# Patient Record
Sex: Female | Born: 1940 | Race: White | Hispanic: No | Smoking: Never smoker
Health system: Southern US, Community
[De-identification: ages and names within clinical notes are randomized; demographics above are authoritative.]

## PROBLEM LIST (undated history)

## (undated) DIAGNOSIS — R9431 Abnormal electrocardiogram [ECG] [EKG]: Secondary | ICD-10-CM

## (undated) DIAGNOSIS — I10 Essential (primary) hypertension: Secondary | ICD-10-CM

## (undated) DIAGNOSIS — E7849 Other hyperlipidemia: Secondary | ICD-10-CM

## (undated) DIAGNOSIS — I639 Cerebral infarction, unspecified: Secondary | ICD-10-CM

## (undated) DIAGNOSIS — Z803 Family history of malignant neoplasm of breast: Secondary | ICD-10-CM

## (undated) DIAGNOSIS — M2011 Hallux valgus (acquired), right foot: Secondary | ICD-10-CM

## (undated) DIAGNOSIS — M21629 Bunionette of unspecified foot: Secondary | ICD-10-CM

## (undated) DIAGNOSIS — M2041 Other hammer toe(s) (acquired), right foot: Secondary | ICD-10-CM

## (undated) DIAGNOSIS — E785 Hyperlipidemia, unspecified: Secondary | ICD-10-CM

## (undated) DIAGNOSIS — M2042 Other hammer toe(s) (acquired), left foot: Secondary | ICD-10-CM

## (undated) HISTORY — DX: Hyperlipidemia, unspecified: E78.5

## (undated) HISTORY — DX: Abnormal electrocardiogram (ECG) (EKG): R94.31

## (undated) HISTORY — PX: APPENDECTOMY: SHX54

## (undated) HISTORY — DX: Bunionette of unspecified foot: M21.629

## (undated) HISTORY — DX: Other hammer toe(s) (acquired), left foot: M20.42

## (undated) HISTORY — DX: Essential (primary) hypertension: I10

## (undated) HISTORY — DX: Other hammer toe(s) (acquired), right foot: M20.41

## (undated) HISTORY — DX: Other hyperlipidemia: E78.49

## (undated) HISTORY — DX: Hallux valgus (acquired), right foot: M20.11

## (undated) HISTORY — DX: Family history of malignant neoplasm of breast: Z80.3

## (undated) HISTORY — DX: Cerebral infarction, unspecified: I63.9

## (undated) HISTORY — PX: ABDOMINAL HYSTERECTOMY: SHX81

## (undated) HISTORY — PX: KNEE SURGERY: SHX244

---

## 2015-06-05 DIAGNOSIS — R9431 Abnormal electrocardiogram [ECG] [EKG]: Secondary | ICD-10-CM

## 2015-06-05 DIAGNOSIS — E7849 Other hyperlipidemia: Secondary | ICD-10-CM

## 2015-06-05 DIAGNOSIS — E785 Hyperlipidemia, unspecified: Secondary | ICD-10-CM

## 2015-06-05 DIAGNOSIS — I10 Essential (primary) hypertension: Secondary | ICD-10-CM

## 2015-06-05 HISTORY — DX: Hyperlipidemia, unspecified: E78.5

## 2015-06-05 HISTORY — DX: Other hyperlipidemia: E78.49

## 2015-06-05 HISTORY — DX: Abnormal electrocardiogram (ECG) (EKG): R94.31

## 2015-06-05 HISTORY — DX: Essential (primary) hypertension: I10

## 2016-01-16 DIAGNOSIS — M2011 Hallux valgus (acquired), right foot: Secondary | ICD-10-CM

## 2016-01-16 DIAGNOSIS — M2042 Other hammer toe(s) (acquired), left foot: Secondary | ICD-10-CM

## 2016-01-16 DIAGNOSIS — M21629 Bunionette of unspecified foot: Secondary | ICD-10-CM | POA: Insufficient documentation

## 2016-01-16 DIAGNOSIS — M2041 Other hammer toe(s) (acquired), right foot: Secondary | ICD-10-CM

## 2016-01-16 HISTORY — DX: Bunionette of unspecified foot: M21.629

## 2016-01-16 HISTORY — DX: Other hammer toe(s) (acquired), right foot: M20.41

## 2016-01-16 HISTORY — DX: Hallux valgus (acquired), right foot: M20.11

## 2016-07-07 ENCOUNTER — Other Ambulatory Visit: Payer: Self-pay

## 2017-06-14 ENCOUNTER — Other Ambulatory Visit: Payer: Self-pay

## 2017-06-14 MED ORDER — POTASSIUM CHLORIDE CRYS ER 20 MEQ PO TBCR: 20.0000 meq | EXTENDED_RELEASE_TABLET | Freq: Every day | ORAL | 0 refills | Status: DC

## 2017-06-14 NOTE — Telephone Encounter (Signed)
Patient states that she will be following Dr. Bettina Gavia in [facility]. Appointment made for 07/06/17.

## 2017-07-02 NOTE — Progress Notes (Signed)
Cardiology Office Note:    Date:  07/06/2017   ID:  Laura Mcintyre, DOB 1941/02/05, MRN [id number]  PCP:  Dr Venetia Maxon  Please check her lipids at next labs  Cardiologist:  Shirlee More, MD    Referring MD: No ref. provider found    ASSESSMENT:    1. Essential hypertension   2. Hyperlipidemia, unspecified hyperlipidemia type    PLAN:    In order of problems listed above:  1. poorly controlled, previously she also took hydralazine which will resume check home blood pressures she will call in one week and may require up titration.she will continue her ACE inhibitor and beta blocker. 2. Stable continue her intermediate intensity statin and she'll have a lipid profile performed with her  Next scheduled phlebotomy   Next appointment: 6 months   Medication Adjustments/Labs and Tests Ordered: Current medicines are reviewed at length with the patient today.  Concerns regarding medicines are outlined above.  Orders Placed This Encounter  Procedures  . Lipid Profile  . EKG 12-Lead   Meds ordered this encounter  Medications  . hydrALAZINE (APRESOLINE) 25 MG tablet    Sig: Take 0.5 tablets (12.5 mg total) by mouth 3 (three) times daily.    Dispense:  90 tablet    Refill:  6    Chief Complaint  Patient presents with  . Follow-up    1 year flup appt   . Hypertension  . Hyperlipidemia    History of Present Illness:    Laura Mcintyre is a 76 y.o. female with a hx of Dyslipidemia, HTN last seen one year ago.her edema resolved off an NSAID. Her BP has been " good "at PCP office and at home.she is now taking Plaquenil for arthritis.she has had no chest pain shortness of breath palpitation or syncope. Her EKG pattern is stable  Compliance with diet, lifestyle and medications: yes Past Medical History:  Diagnosis Date  . Abnormal EKG 06/05/2015   Overview:  stress echo is negative for ischemia Oct 2011  . Abnormal electrocardiography 06/05/2015   Overview:  Overview:  stress echo is  negative for ischemia Oct 2011  . Essential hypertension 06/05/2015  . 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 Hallux valgus of right foot 01/16/2016  . 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 Hyperlipidemia 06/05/2015  . 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 ABDOMINAL HYSTERECTOMY    . APPENDECTOMY    . KNEE SURGERY      Current Medications: Current Meds  Medication Sig  . aspirin 325 MG tablet Take 325 mg by mouth daily.  . calcium-vitamin D (OSCAL 500/200 D-3) 500-200 MG-UNIT tablet Take 2 tablets by mouth daily.  Marland Kitchen estrogens, conjugated, (PREMARIN) 1.25 MG tablet Take 1.25 mg by mouth daily.  . Flaxseed, Linseed, (FLAXSEED OIL) 1000 MG CAPS Take 1 capsule by mouth 2 (two) times daily.  . furosemide (LASIX) 40 MG tablet Take 40 mg by mouth daily.  . hydroxychloroquine (PLAQUENIL) 200 MG tablet Take 100 mg by mouth 2 (two) times daily.  Marland Kitchen lisinopril (PRINIVIL,ZESTRIL) 40 MG tablet Take 40 mg by mouth daily.  . metoprolol tartrate (LOPRESSOR) 50 MG tablet Take 25 mg by mouth daily.  . Omega-3 Fatty Acids (FISH OIL PO) Take 2 tablets by mouth daily.  . potassium chloride SA (K-DUR,KLOR-CON) 20 MEQ tablet Take 1 tablet (20 mEq total) by mouth daily.  . ranitidine (ZANTAC) 150 MG tablet Take 150 mg by mouth daily.  Marland Kitchen  simvastatin (ZOCOR) 10 MG tablet TAKE 1 TABLET BY MOUTH 3 TIMES A WEEK. ON MONDAY, WEDNESDAY, AND FRIDAY     Allergies:   Atorvastatin; Celecoxib; Colesevelam; Ezetimibe; Fosinopril; Ibuprofen; Morphine; Oxycodone; Simvastatin; Statins; and Triamcinolone acetonide   Social History   Social History  . Marital status: Legally Separated    Spouse name: N/A  . Number of children: N/A  . Years of education: N/A   Social History Main Topics  . Smoking status: Never Smoker  . Smokeless tobacco: Never Used  . Alcohol use No  . Drug use: No  . Sexual activity: Not Asked   Other Topics Concern  . None   Social History Narrative    . None     Family History: The patient's family history includes Cancer in her sister; Heart attack in her brother; Heart failure in her mother; Stroke in her mother. ROS:   Please see the history of present illness.    All other systems reviewed and are negative.  EKGs/Labs/Other Studies Reviewed:    The following studies were reviewed today:  EKG:  EKG ordered today.  The ekg ordered today demonstrates [place], qs in V2 pattern is unchanged from one year ago.  Recent Labs:  No results found for requested labs within last 8760 hours.  Recent Lipid Panel No results found for: CHOL, TRIG, HDL, CHOLHDL, VLDL, LDLCALC, LDLDIRECT  Physical Exam:    VS:  BP (!) 182/98   Pulse (!) 56   Ht 5\' 5"  (1.651 m)   Wt 163 lb 1.9 oz (74 kg)   SpO2 97%   BMI 27.14 kg/m     Wt Readings from Last 3 Encounters:  07/06/17 163 lb 1.9 oz (74 kg)     GEN:  Well nourished, well developed in no acute distress HEENT: Normal NECK: No JVD; No carotid bruits LYMPHATICS: No lymphadenopathy CARDIAC: RRR, no murmurs, rubs, gallops RESPIRATORY:  Clear to auscultation without rales, wheezing or rhonchi  ABDOMEN: Soft, non-tender, non-distended MUSCULOSKELETAL:  No edema; No deformity  SKIN: Warm and dry NEUROLOGIC:  Alert and oriented x 3 PSYCHIATRIC:  Normal affect    Signed, Shirlee More, MD  07/06/2017 12:19 PM    [place]

## 2017-07-06 ENCOUNTER — Ambulatory Visit (INDEPENDENT_AMBULATORY_CARE_PROVIDER_SITE_OTHER): Payer: Medicare Other | Admitting: Cardiology

## 2017-07-06 ENCOUNTER — Encounter: Payer: Self-pay | Admitting: Cardiology

## 2017-07-06 VITALS — BP 182/98 | HR 56 | Ht 65.0 in | Wt 163.1 lb

## 2017-07-06 DIAGNOSIS — I10 Essential (primary) hypertension: Secondary | ICD-10-CM

## 2017-07-06 DIAGNOSIS — E785 Hyperlipidemia, unspecified: Secondary | ICD-10-CM

## 2017-07-06 MED ORDER — HYDRALAZINE HCL 25 MG PO TABS: 12.5000 mg | ORAL_TABLET | Freq: Three times a day (TID) | ORAL | 6 refills | Status: DC

## 2017-07-06 NOTE — Patient Instructions (Signed)
Medication Instructions:  Your physician has recommended you make the following change in your medication:  START hydralazine 12.5 mg three times daily.  Check BP daily and call in 1 week with readings.  Labwork: Your physician recommends that you return for lab work at Dr. Ebony Hail office.  Testing/Procedures: None  Follow-Up: Your physician wants you to follow-up in: 6 months. You will receive a reminder letter in the mail two months in advance. If you don't receive a letter, please call our office to schedule the follow-up appointment.   Any Other Special Instructions Will Be Listed Below (If Applicable).     If you need a refill on your cardiac medications before your next appointment, please call your pharmacy.

## 2017-07-15 ENCOUNTER — Telehealth: Payer: Self-pay

## 2017-07-15 ENCOUNTER — Other Ambulatory Visit: Payer: Self-pay

## 2017-07-15 DIAGNOSIS — I10 Essential (primary) hypertension: Secondary | ICD-10-CM

## 2017-07-15 MED ORDER — HYDRALAZINE HCL 25 MG PO TABS: 25.0000 mg | ORAL_TABLET | Freq: Three times a day (TID) | ORAL | 3 refills | Status: DC

## 2017-07-15 NOTE — Telephone Encounter (Signed)
Patient advised to increase hydralazine to 1 tablet=25 mg three times daily. Patient verbalized understanding. Refill sent so that when time to pick up new prescription the bottle will read 1 tablet instead of 0.5 tablet.

## 2017-07-15 NOTE — Telephone Encounter (Signed)
Home blood pressure readings from the past week.  9/5   142/68 p 58 9/6   149/75 po 69 9/7   152/80 p 66 9/8   137/73 p 76 9/9   128/62 p 64  9/10  136/78 p 58 9/11  154/95 11 am rechecked: 145/61 p 61 9/12  135/73 p 68

## 2017-07-15 NOTE — Telephone Encounter (Signed)
Increase hydralazine to 1=25 mg TID

## 2017-07-21 ENCOUNTER — Other Ambulatory Visit: Payer: Self-pay

## 2017-07-21 MED ORDER — SIMVASTATIN 10 MG PO TABS: ORAL_TABLET | ORAL | 3 refills | Status: DC

## 2017-09-22 ENCOUNTER — Telehealth: Payer: Self-pay | Admitting: Cardiology

## 2017-09-22 NOTE — Telephone Encounter (Signed)
[facility] [address]

## 2017-09-27 ENCOUNTER — Other Ambulatory Visit: Payer: Self-pay

## 2017-09-27 MED ORDER — POTASSIUM CHLORIDE CRYS ER 20 MEQ PO TBCR: 20.0000 meq | EXTENDED_RELEASE_TABLET | Freq: Every day | ORAL | 3 refills | Status: DC

## 2017-10-20 ENCOUNTER — Other Ambulatory Visit: Payer: Self-pay

## 2017-10-20 MED ORDER — LISINOPRIL 40 MG PO TABS: 40.0000 mg | ORAL_TABLET | Freq: Every day | ORAL | 3 refills | Status: DC

## 2017-10-25 ENCOUNTER — Other Ambulatory Visit: Payer: Self-pay

## 2017-10-25 DIAGNOSIS — I10 Essential (primary) hypertension: Secondary | ICD-10-CM

## 2017-10-25 MED ORDER — HYDRALAZINE HCL 25 MG PO TABS: 25.0000 mg | ORAL_TABLET | Freq: Three times a day (TID) | ORAL | 2 refills | Status: DC

## 2018-03-20 NOTE — Progress Notes (Signed)
Cardiology Office Note:    Date:  03/21/2018   ID:  Laura Mcintyre, DOB [birth year], MRN [id number]  PCP:  Street, [place], MD  Cardiologist:  Shirlee More, MD    Referring MD: [address] [place], *    ASSESSMENT:    1. Essential hypertension   2. Hyperlipidemia, unspecified hyperlipidemia type    PLAN:    In order of problems listed above:  1. Stable blood pressure target continue current treatment 2. Stable lipids at target continue intermediate intensity statin   Next appointment: 6 months   Medication Adjustments/Labs and Tests Ordered: Current medicines are reviewed at length with the patient today.  Concerns regarding medicines are outlined above.  No orders of the defined types were placed in this encounter.  Meds ordered this encounter  Medications  . simvastatin (ZOCOR) 10 MG tablet    Sig: TAKE 1 TABLET BY MOUTH 3 TIMES A WEEK. ON MONDAY, WEDNESDAY, AND FRIDAY    Dispense:  45 tablet    Refill:  3  . aspirin 81 MG tablet    Sig: Take 1 tablet (81 mg total) by mouth daily.    Dispense:  30 tablet    Refill:  3    Chief Complaint  Patient presents with  . Follow-up  . Hypertension  . Hyperlipidemia    History of Present Illness:    Laura Mcintyre is a 77 y.o. female with a hx of Dyslipidemia and HTN  last seen 07/06/17.  ASSESSMENT:    07/06/17   1. Essential hypertension   2. Hyperlipidemia, unspecified hyperlipidemia type    PLAN:    1.    Poorly controlled, previously she also took hydralazine which will resume check home blood pressures she will call in one week and may require up titration.she will continue her ACE inhibitor diuretic and beta blocker. 3. Stable continue her intermediate intensity statin and she'll have a lipid profile performed with her  Next scheduled phlebotomy  Compliance with diet, lifestyle and medications: Yes  She has joint pain but overall is pleased with the quality of her life no chest pain shortness of  breath palpitation or syncope.  She increased her statin to daily and noticed recurrent muscle symptoms and decreased it back to 3 days/week which is well-tolerated Past Medical History:  Diagnosis Date  . Abnormal EKG 06/05/2015   Overview:  stress echo is negative for ischemia Oct 2011  . Abnormal electrocardiography 06/05/2015   Overview:  Overview:  stress echo is negative for ischemia Oct 2011  . Essential hypertension 06/05/2015  . 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 Hallux valgus of right foot 01/16/2016  . 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 Hyperlipidemia 06/05/2015  . 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 ABDOMINAL HYSTERECTOMY    . APPENDECTOMY    . KNEE SURGERY      Current Medications: Current Meds  Medication Sig  . aspirin 81 MG tablet Take 1 tablet (81 mg total) by mouth daily.  . calcium-vitamin D (OSCAL 500/200 D-3) 500-200 MG-UNIT tablet Take 2 tablets by mouth daily.  Marland Kitchen estrogens, conjugated, (PREMARIN) 1.25 MG tablet Take 1.25 mg by mouth daily.  . Flaxseed, Linseed, (FLAXSEED OIL) 1000 MG CAPS Take 1 capsule by mouth daily.   . furosemide (LASIX) 40 MG tablet Take 40 mg by mouth daily.  . hydrALAZINE (APRESOLINE) 25 MG tablet Take 1 tablet (25 mg total) by mouth 3 (three) times daily.  Marland Kitchen  hydroxychloroquine (PLAQUENIL) 200 MG tablet Take 100 mg by mouth 2 (two) times daily.  Marland Kitchen lisinopril (PRINIVIL,ZESTRIL) 40 MG tablet Take 1 tablet (40 mg total) by mouth daily.  . metoprolol tartrate (LOPRESSOR) 50 MG tablet Take 25 mg by mouth daily.  . Omega-3 Fatty Acids (FISH OIL PO) Take 1 tablet by mouth daily.   . potassium chloride SA (K-DUR,KLOR-CON) 20 MEQ tablet Take 1 tablet (20 mEq total) by mouth daily.  . ranitidine (ZANTAC) 150 MG tablet Take 150 mg by mouth daily.  . simvastatin (ZOCOR) 10 MG tablet TAKE 1 TABLET BY MOUTH 3 TIMES A WEEK. ON MONDAY, WEDNESDAY, AND FRIDAY  . [DISCONTINUED] aspirin 325 MG tablet Take 325 mg  by mouth daily.  . [DISCONTINUED] simvastatin (ZOCOR) 10 MG tablet TAKE 1 TABLET BY MOUTH 3 TIMES A WEEK. ON MONDAY, WEDNESDAY, AND FRIDAY     Allergies:   Atorvastatin; Celecoxib; Colesevelam; Ezetimibe; Fosinopril; Ibuprofen; Morphine; Oxycodone; Simvastatin; Statins; and Triamcinolone acetonide   Social History   Socioeconomic History  . Marital status: Legally Separated    Spouse name: Not on file  . Number of children: Not on file  . Years of education: Not on file  . Highest education level: Not on file  Occupational History  . Not on file  Social Needs  . Financial resource strain: Not on file  . Food insecurity:    Worry: Not on file    Inability: Not on file  . Transportation needs:    Medical: Not on file    Non-medical: Not on file  Tobacco Use  . Smoking status: Never Smoker  . Smokeless tobacco: Never Used  Substance and Sexual Activity  . Alcohol use: No  . Drug use: No  . Sexual activity: Not on file  Lifestyle  . Physical activity:    Days per week: Not on file    Minutes per session: Not on file  . Stress: Not on file  Relationships  . Social connections:    Talks on phone: Not on file    Gets together: Not on file    Attends religious service: Not on file    Active member of club or organization: Not on file    Attends meetings of clubs or organizations: Not on file    Relationship status: Not on file  Other Topics Concern  . Not on file  Social History Narrative  . Not on file     Family History: The patient's family history includes Cancer in her sister; Heart attack in her brother; Heart failure in her mother; Stroke in her mother. ROS:   Please see the history of present illness.    All other systems reviewed and are negative.  EKGs/Labs/Other Studies Reviewed:    The following studies were reviewed today:  EKG:  EKG ordered today.  The ekg ordered today demonstrates sinus rhythm normal  Recent Labs:02/10/18 Cr normal No results  found for requested labs within last 8760 hours.  Recent Lipid Panel  07/15/17 Chol 209 HDL 68 LDL 91 No results found for: CHOL, TRIG, HDL, CHOLHDL, VLDL, LDLCALC, LDLDIRECT  Physical Exam:    VS:  BP 114/76 (BP Location: Right Arm, Patient Position: Sitting, Cuff Size: Normal)   Pulse 72   Ht 5\' 5"  (1.651 m)   Wt 164 lb 1.9 oz (74.4 kg)   SpO2 97%   BMI 27.31 kg/m     Wt Readings from Last 3 Encounters:  03/21/18 164 lb 1.9 oz (74.4 kg)  07/06/17 163 lb 1.9 oz (74 kg)     GEN:  Well nourished, well developed in no acute distress HEENT: Normal NECK: No JVD; No carotid bruits LYMPHATICS: No lymphadenopathy CARDIAC: RRR, no murmurs, rubs, gallops RESPIRATORY:  Clear to auscultation without rales, wheezing or rhonchi  ABDOMEN: Soft, non-tender, non-distended MUSCULOSKELETAL:  No edema; No deformity  SKIN: Warm and dry NEUROLOGIC:  Alert and oriented x 3 PSYCHIATRIC:  Normal affect    Signed, Shirlee More, MD  03/21/2018 3:34 PM    [facility]

## 2018-03-21 ENCOUNTER — Encounter: Payer: Self-pay | Admitting: Cardiology

## 2018-03-21 ENCOUNTER — Ambulatory Visit (INDEPENDENT_AMBULATORY_CARE_PROVIDER_SITE_OTHER): Payer: Medicare Other | Admitting: Cardiology

## 2018-03-21 VITALS — BP 114/76 | HR 72 | Ht 65.0 in | Wt 164.1 lb

## 2018-03-21 DIAGNOSIS — E785 Hyperlipidemia, unspecified: Secondary | ICD-10-CM

## 2018-03-21 DIAGNOSIS — R9431 Abnormal electrocardiogram [ECG] [EKG]: Secondary | ICD-10-CM

## 2018-03-21 DIAGNOSIS — I10 Essential (primary) hypertension: Secondary | ICD-10-CM | POA: Diagnosis not present

## 2018-03-21 MED ORDER — ASPIRIN 81 MG PO TABS: 81.0000 mg | ORAL_TABLET | Freq: Every day | ORAL | 3 refills | Status: AC

## 2018-03-21 MED ORDER — SIMVASTATIN 10 MG PO TABS: ORAL_TABLET | ORAL | 3 refills | Status: DC

## 2018-03-21 NOTE — Patient Instructions (Addendum)
Hypertension Hypertension is another name for high blood pressure. High blood pressure forces your heart to work harder to pump blood. This can cause problems over time. There are two numbers in a blood pressure reading. There is a top number (systolic) over a bottom number (diastolic). It is best to have a blood pressure below 120/80. Healthy choices can help lower your blood pressure. You may need medicine to help lower your blood pressure if:  Your blood pressure cannot be lowered with healthy choices.  Your blood pressure is higher than 130/80.  Follow these instructions at home: Eating and drinking  If directed, follow the DASH eating plan. This diet includes: ? Filling half of your plate at each meal with fruits and vegetables. ? Filling one quarter of your plate at each meal with whole grains. Whole grains include whole wheat pasta, brown rice, and whole grain bread. ? Eating or drinking low-fat dairy products, such as skim milk or low-fat yogurt. ? Filling one quarter of your plate at each meal with low-fat (lean) proteins. Low-fat proteins include fish, skinless chicken, eggs, beans, and tofu. ? Avoiding fatty meat, cured and processed meat, or chicken with skin. ? Avoiding premade or processed food.  Eat less than 1,500 mg of salt (sodium) a day.  Limit alcohol use to no more than 1 drink a day for nonpregnant women and 2 drinks a day for men. One drink equals 12 oz of beer, 5 oz of wine, or 1 oz of hard liquor. Lifestyle  Work with your doctor to stay at a healthy weight or to lose weight. Ask your doctor what the best weight is for you.  Get at least 30 minutes of exercise that causes your heart to beat faster (aerobic exercise) most days of the week. This may include walking, swimming, or biking.  Get at least 30 minutes of exercise that strengthens your muscles (resistance exercise) at least 3 days a week. This may include lifting weights or pilates.  Do not use any  products that contain nicotine or tobacco. This includes cigarettes and e-cigarettes. If you need help quitting, ask your doctor.  Check your blood pressure at home as told by your doctor.  Keep all follow-up visits as told by your doctor. This is important. Medicines  Take over-the-counter and prescription medicines only as told by your doctor. Follow directions carefully.  Do not skip doses of blood pressure medicine. The medicine does not work as well if you skip doses. Skipping doses also puts you at risk for problems.  Ask your doctor about side effects or reactions to medicines that you should watch for. Contact a doctor if:  You think you are having a reaction to the medicine you are taking.  You have headaches that keep coming back (recurring).  You feel dizzy.  You have swelling in your ankles.  You have trouble with your vision. Get help right away if:  You get a very bad headache.  You start to feel confused.  You feel weak or numb.  You feel faint.  You get very bad pain in your: ? Chest. ? Belly (abdomen).  You throw up (vomit) more than once.  You have trouble breathing. Summary  Hypertension is another name for high blood pressure.  Making healthy choices can help lower blood pressure. If your blood pressure cannot be controlled with healthy choices, you may need to take medicine. This information is not intended to replace advice given to you by your health care   provider. Make sure you discuss any questions you have with your health care provider. Document Released: 04/06/2008 Document Revised: 09/16/2016 Document Reviewed: 09/16/2016 Elsevier Interactive Patient Education  2018 [employer].   Medication Instructions:  Your physician has recommended you make the following change in your medication:  DECREASE aspirin 81 mg daily DECREASE simvastatin 10 mg on Monday, Wednesday, and Friday only  Labwork: None  Testing/Procedures: You had an  EKG today.   Follow-Up: Your physician wants you to follow-up in: 6 months. You will receive a reminder letter in the mail two months in advance. If you don't receive a letter, please call our office to schedule the follow-up appointment.   If you need a refill on your cardiac medications before your next appointment, please call your pharmacy.   Thank you for choosing [facility]! Robyne Peers, RN [phone]

## 2018-03-29 ENCOUNTER — Telehealth: Payer: Self-pay | Admitting: Cardiology

## 2018-03-29 MED ORDER — METOPROLOL TARTRATE 50 MG PO TABS: 25.0000 mg | ORAL_TABLET | Freq: Every day | ORAL | 3 refills | Status: DC

## 2018-03-29 NOTE — Telephone Encounter (Signed)
Refill sent.

## 2018-03-29 NOTE — Telephone Encounter (Signed)
° °  1. Which medications need to be refilled? (please list name of each medication and dose if known) metoprolol 50mg   2. Which pharmacy/location (including street and city if local pharmacy) is [facility] sent to?[facility] [place] on [address]

## 2018-03-31 ENCOUNTER — Telehealth: Payer: Self-pay

## 2018-03-31 NOTE — Telephone Encounter (Signed)
We [facility].  The refill was requested differently than what he had recorded in system. I called to patient to verify dose, and patient verified dose.   Patient also states that she picked up prescription on 03-29-18, and does not need any refills today.

## 2018-04-18 ENCOUNTER — Telehealth: Payer: Self-pay | Admitting: Cardiology

## 2018-04-18 NOTE — Telephone Encounter (Signed)
Is having a major problem with the last BP med he gave her

## 2018-04-18 NOTE — Telephone Encounter (Signed)
Patient has been taking hydralazine 25 mg three times daily since November, 2018. At the beginning of this year, the patient started to develop headaches and dizziness. Patient began to take allergy medicine because she thought it was related to the weather and changing seasons. The patient's hair has also begun to fall out. The patient has had side effects related to medications in the past and believes now that these side effects are related to the hydralazine.   The patient is also currently on lisinopril 40 mg daily. The patient states that she took lisinopril for a while prior to the hydralazine. The patient would like to know if she can stop taking hydralazine and continue the lisinopril 40 mg. The patient asked if she could take a lower dose of the lisinopril in the evening.  Please advise.

## 2018-04-18 NOTE — Telephone Encounter (Signed)
Yes, 40 mg dose

## 2018-04-18 NOTE — Telephone Encounter (Signed)
Advised patient to take lisinopril 40 mg once daily and stop hydralazine. Advised patient to check blood pressure daily and record them. Advised patient to return call if blood pressure starts to go up. Patient verbalized understanding. No further questions.

## 2018-09-19 ENCOUNTER — Telehealth: Payer: Self-pay | Admitting: *Deleted

## 2018-09-19 MED ORDER — SIMVASTATIN 10 MG PO TABS: ORAL_TABLET | ORAL | 3 refills | Status: DC

## 2018-09-19 MED ORDER — POTASSIUM CHLORIDE CRYS ER 20 MEQ PO TBCR: 20.0000 meq | EXTENDED_RELEASE_TABLET | Freq: Every day | ORAL | 3 refills | Status: AC

## 2018-09-19 MED ORDER — METOPROLOL TARTRATE 50 MG PO TABS: 25.0000 mg | ORAL_TABLET | Freq: Every day | ORAL | 3 refills | Status: AC

## 2018-09-19 MED ORDER — FUROSEMIDE 40 MG PO TABS: 40.0000 mg | ORAL_TABLET | Freq: Every day | ORAL | 1 refills | Status: DC

## 2018-09-19 NOTE — Telephone Encounter (Signed)
*  STAT* If patient is at the pharmacy, call can be transferred to refill team.   1. Which medications need to be refilled? (please list name of each medication and dose if known) Potassium 20 meq qd, Furosemide 40 qd, Metoprolol 50 mg, 25 mg qd and Simvastatin 10 mg M,W, and Friday  2. Which pharmacy/location (including street and city if local pharmacy) is [facility] to?[facility] [address] [facility]. Do they need a 30 day or 90 day supply? [place]

## 2018-09-20 ENCOUNTER — Other Ambulatory Visit: Payer: Self-pay

## 2018-09-28 ENCOUNTER — Ambulatory Visit (INDEPENDENT_AMBULATORY_CARE_PROVIDER_SITE_OTHER): Payer: Medicare Other | Admitting: Cardiology

## 2018-09-28 VITALS — BP 152/82 | HR 58 | Ht 65.0 in | Wt 158.0 lb

## 2018-09-28 DIAGNOSIS — I10 Essential (primary) hypertension: Secondary | ICD-10-CM

## 2018-09-28 DIAGNOSIS — E7849 Other hyperlipidemia: Secondary | ICD-10-CM

## 2018-09-28 MED ORDER — TELMISARTAN 80 MG PO TABS: 80.0000 mg | ORAL_TABLET | Freq: Every day | ORAL | 6 refills | Status: DC

## 2018-09-28 NOTE — Addendum Note (Signed)
Addended by: Austin Miles on: 09/28/2018 04:24 PM   Modules accepted: Orders

## 2018-09-28 NOTE — Patient Instructions (Signed)
Medication Instructions:  Your physician has recommended you make the following change in your medication:   STOP lisinopril  START telmisartan (micardis) 80 mg: Take 1 tablet daily   If you need a refill on your cardiac medications before your next appointment, please call your pharmacy.   Lab work: None  If you have labs (blood work) drawn today and your tests are completely normal, you will receive your results only by: Marland Kitchen MyChart Message (if you have MyChart) OR . A paper copy in the mail If you have any lab test that is abnormal or we need to change your treatment, we will call you to review the results.  Testing/Procedures: None  Follow-Up: At [facility], you and your health needs are our priority.  As part of our continuing mission to provide you with exceptional heart care, we have created designated Provider Care Teams.  These Care Teams include your primary Cardiologist (physician) and Advanced Practice Providers (APPs -  Physician Assistants and Nurse Practitioners) who all work together to provide you with the care you need, when you need it. You will need a follow up appointment in 6 months.  Please call our office 2 months in advance to schedule this appointment.     Telmisartan tablets What is this medicine? TELMISARTAN (tel mi SAR tan) is used to treat high blood pressure. This medicine may be used for other purposes; ask your health care provider or pharmacist if you have questions. COMMON BRAND NAME(S): Micardis What should I tell my health care provider before I take this medicine? They need to know if you have any of these conditions: -if you are on a special diet, such as a low-salt diet -kidney or liver disease -an unusual or allergic reaction to telmisartan, other medicines, foods, dyes, or preservatives -pregnant or trying to get pregnant -breast-feeding How should I use this medicine? Take this medicine by mouth with a glass of water. Follow the  directions on the prescription label. This medicine can be taken with or without food. Take your doses at regular intervals. Do not take your medicine more often than directed. Talk to your pediatrician regarding the use of this medicine in children. Special care may be needed. Overdosage: If you think you have taken too much of this medicine contact a poison control center or emergency room at once. NOTE: This medicine is only for you. Do not share this medicine with others. What if I miss a dose? If you miss a dose, take it as soon as you can. If it is almost time for your next dose, take only that dose. Do not take double or extra doses. What may interact with this medicine? -digoxin -potassium salts or potassium supplements -warfarin This list may not describe all possible interactions. Give your health care provider a list of all the medicines, herbs, non-prescription drugs, or dietary supplements you use. Also tell them if you smoke, drink alcohol, or use illegal drugs. Some items may interact with your medicine. What should I watch for while using this medicine? Visit your doctor or health care professional for regular checks on your progress. Check your blood pressure as directed. Ask your doctor or health care professional what your blood pressure should be and when you should contact him or her. Call your doctor or health care professional if you notice an irregular or fast heart beat. Women should inform their doctor if they wish to become pregnant or think they might be pregnant. There is a potential  for serious side effects to an unborn child, particularly in the second or third trimester. Talk to your health care professional or pharmacist for more information. You may get drowsy or dizzy. Do not drive, use machinery, or do anything that needs mental alertness until you know how this drug affects you. Do not stand or sit up quickly, especially if you are an older patient. This reduces the  risk of dizzy or fainting spells. Alcohol can make you more drowsy and dizzy. Avoid alcoholic drinks. Avoid salt substitutes unless you are told otherwise by your doctor or health care professional. Do not treat yourself for coughs, colds, or pain while you are taking this medicine without asking your doctor or health care professional for advice. Some ingredients may increase your blood pressure. What side effects may I notice from receiving this medicine? Side effects that you should report to your doctor or health care professional as soon as possible: -allergic reactions like skin rash, itching or hives, swelling of the face, lips, or tongue -breathing problems -dark urine -gout pain -muscle pains -slow heartbeat -trouble passing urine or change in the amount of urine -unusual bleeding or bruising -yellowing of the eyes or skin Side effects that usually do not require medical attention (report to your doctor or health care professional if they continue or are bothersome): -back pain -change in sex drive or performance -diarrhea -sore throat or stuffy nose This list may not describe all possible side effects. Call your doctor for medical advice about side effects. You may report side effects to FDA at 1-800-FDA-1088. Where should I keep my medicine? Keep out of the reach of children. Store at room temperature between 15 and 30 degrees C (59 and 86 degrees F). Tablets should not be removed from the blisters until right before use. Throw away any unused medicine after the expiration date. NOTE: This sheet is a summary. It may not cover all possible information. If you have questions about this medicine, talk to your doctor, pharmacist, or health care provider.  2018 Elsevier/Gold Standard (2008-01-04 13:39:10)

## 2018-09-28 NOTE — Progress Notes (Signed)
Cardiology Office Note:    Date:  09/28/2018   ID:  Laura Mcintyre, DOB [birth year], MRN [id number]  PCP:  Street, Sharon Mt, MD  Cardiologist:  Shirlee More, MD    Referring MD: [address], Sharon Mt, *    ASSESSMENT:    1. Essential hypertension   2. Familial hyperlipidemia    PLAN:    In order of problems listed above:  1. Unfortunately despite compliance and sodium restriction blood pressure is drifting out of range I will have her continue her beta-blocker diuretic and switch to a more potent ARB telmisartan. 2. Stable lipids are ideal continue her current medium intensity statin   Next appointment: 6 months   Medication Adjustments/Labs and Tests Ordered: Current medicines are reviewed at length with the patient today.  Concerns regarding medicines are outlined above.  No orders of the defined types were placed in this encounter.  No orders of the defined types were placed in this encounter.   No chief complaint on file.   History of Present Illness:    Laura Mcintyre is a 77 y.o. female with a hx of hypertension and dyslipidemia last seen 03/21/18. Compliance with diet, lifestyle and medications: Yes Home blood pressures running in the range of 1 40-1 60 more than half of the time.  She is compliant with medications and previously when I gave her hydralazine she experienced migraine headache.  To intensify her antihypertensive therapy will discontinue ACE switch her to ARB and I think that she will be at target less than 258 systolic.  She has had no chest pain edema shortness of breath palpitation or syncope recent lipid profile is at target cholesterol 191 LDL 103 HDL 58 TSH and creatinine are normal EKG last visit unremarkable Past Medical History:  Diagnosis Date  . Abnormal EKG 06/05/2015   Overview:  stress echo is negative for ischemia Oct 2011  . Abnormal electrocardiography 06/05/2015   Overview:  Overview:  stress echo is negative for ischemia Oct 2011  .  Essential hypertension 06/05/2015  . 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 Hallux valgus of right foot 01/16/2016  . 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 Hyperlipidemia 06/05/2015  . 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 ABDOMINAL HYSTERECTOMY    . APPENDECTOMY    . KNEE SURGERY      Current Medications: Current Meds  Medication Sig  . aspirin 81 MG tablet Take 1 tablet (81 mg total) by mouth daily.  . calcium-vitamin D (OSCAL 500/200 D-3) 500-200 MG-UNIT tablet Take 1 tablet by mouth daily.   Marland Kitchen estrogens, conjugated, (PREMARIN) 1.25 MG tablet Take 1.25 mg by mouth daily.  . furosemide (LASIX) 40 MG tablet Take 1 tablet (40 mg total) by mouth daily.  . hydroxychloroquine (PLAQUENIL) 200 MG tablet Take 100 mg by mouth 2 (two) times daily.  Marland Kitchen lisinopril (PRINIVIL,ZESTRIL) 40 MG tablet Take 1 tablet (40 mg total) by mouth daily.  . metoprolol tartrate (LOPRESSOR) 50 MG tablet Take 0.5 tablets (25 mg total) by mouth daily.  . Omega-3 Fatty Acids (FISH OIL PO) Take 1 tablet by mouth 2 (two) times daily.   . potassium chloride SA (K-DUR,KLOR-CON) 20 MEQ tablet Take 1 tablet (20 mEq total) by mouth daily.  . ranitidine (ZANTAC) 150 MG tablet Take 150 mg by mouth daily.  . simvastatin (ZOCOR) 10 MG tablet TAKE 1 TABLET BY MOUTH 3 TIMES A WEEK. ON MONDAY, WEDNESDAY, AND FRIDAY  Allergies:   Hydralazine hcl; Atorvastatin; Celecoxib; Colesevelam; Ezetimibe; Fosinopril; Ibuprofen; Morphine; Oxycodone; Simvastatin; Statins; and Triamcinolone acetonide   Social History   Socioeconomic History  . Marital status: Legally Separated    Spouse name: Not on file  . Number of children: Not on file  . Years of education: Not on file  . Highest education level: Not on file  Occupational History  . Not on file  Social Needs  . Financial resource strain: Not on file  . Food insecurity:    Worry: Not on file    Inability: Not on file  . Transportation  needs:    Medical: Not on file    Non-medical: Not on file  Tobacco Use  . Smoking status: Never Smoker  . Smokeless tobacco: Never Used  Substance and Sexual Activity  . Alcohol use: No  . Drug use: No  . Sexual activity: Not on file  Lifestyle  . Physical activity:    Days per week: Not on file    Minutes per session: Not on file  . Stress: Not on file  Relationships  . Social connections:    Talks on phone: Not on file    Gets together: Not on file    Attends religious service: Not on file    Active member of club or organization: Not on file    Attends meetings of clubs or organizations: Not on file    Relationship status: Not on file  Other Topics Concern  . Not on file  Social History Narrative  . Not on file     Family History: The patient's family history includes Cancer in her sister; Heart attack in her brother; Heart failure in her mother; Stroke in her mother. ROS:   Please see the history of present illness.    All other systems reviewed and are negative.  EKGs/Labs/Other Studies Reviewed:    The following studies were reviewed today:   Recent Labs: No results found for requested labs within last 8760 hours.  Recent Lipid Panel No results found for: CHOL, TRIG, HDL, CHOLHDL, VLDL, LDLCALC, LDLDIRECT  Physical Exam:    VS:  BP (!) 152/82 (BP Location: Right Arm, Patient Position: Sitting, Cuff Size: Normal)   Pulse (!) 58   Ht 5\' 5"  (1.651 m)   Wt 158 lb (71.7 kg)   SpO2 98%   BMI 26.29 kg/m     Wt Readings from Last 3 Encounters:  09/28/18 158 lb (71.7 kg)  03/21/18 164 lb 1.9 oz (74.4 kg)  07/06/17 163 lb 1.9 oz (74 kg)     GEN:  Well nourished, well developed in no acute distress HEENT: Normal NECK: No JVD; No carotid bruits LYMPHATICS: No lymphadenopathy CARDIAC: RRR, no murmurs, rubs, gallops RESPIRATORY:  Clear to auscultation without rales, wheezing or rhonchi  ABDOMEN: Soft, non-tender, non-distended MUSCULOSKELETAL:  No edema;  No deformity  SKIN: Warm and dry NEUROLOGIC:  Alert and oriented x 3 PSYCHIATRIC:  Normal affect    Signed, Shirlee More, MD  09/28/2018 4:17 PM    [facility]

## 2018-10-03 ENCOUNTER — Telehealth: Payer: Self-pay | Admitting: Cardiology

## 2018-10-03 MED ORDER — LISINOPRIL 40 MG PO TABS: 40.0000 mg | ORAL_TABLET | Freq: Two times a day (BID) | ORAL | 1 refills | Status: DC

## 2018-10-03 NOTE — Telephone Encounter (Signed)
Patient is not going to take the Telmarsartin due to prie and side effects and just wants you to call in an increased does of [facility] to [facility] [address]

## 2018-10-03 NOTE — Telephone Encounter (Signed)
I will follow her wishes double her dose of lisinopril contact us if systolics remain greater than 140

## 2018-10-03 NOTE — Telephone Encounter (Signed)
Patient informed to restart lisinopril and increase dose from 40 mg daily to 40 mg twice daily. New prescription sent to pharmacy. Advised patient to contact our office if her systolic BP remains above 140. Patient verbalized understanding. No further questions.

## 2018-10-03 NOTE — Telephone Encounter (Signed)
Patient reports that she never picked up telmisartan 80 mg after her office visit on 09/28/18 because her insurance will not cover it and it will cost her $440.90 per month. Patient also states that she is very hesitant to start this medication because the number one side effect people complain of is dizziness according to the pharmacy and she already has vertigo and lives alone. Patient wishes to stay on lisinopril and increase this dose to help better control her hypertension. Will have Dr. Bettina Gavia advise.

## 2019-03-17 ENCOUNTER — Telehealth: Payer: Self-pay | Admitting: *Deleted

## 2019-03-17 MED ORDER — FUROSEMIDE 40 MG PO TABS: 40.0000 mg | ORAL_TABLET | Freq: Every day | ORAL | 1 refills | Status: AC

## 2019-03-17 NOTE — Telephone Encounter (Signed)
Rx refill sent to pharmacy.  *STAT* If patient is at the pharmacy, call can be transferred to refill team.   1. Which medications need to be refilled? (please list name of each medication and dose if known) Furosemide 40 mg qd  2. Which pharmacy/location (including street and city if local pharmacy) is medication to be sent to?[facility]  3. Do they need a 30 day or 90 day supply? [place]

## 2019-04-26 ENCOUNTER — Other Ambulatory Visit: Payer: Self-pay | Admitting: *Deleted

## 2019-04-26 MED ORDER — LISINOPRIL 40 MG PO TABS: 40.0000 mg | ORAL_TABLET | Freq: Two times a day (BID) | ORAL | 0 refills | Status: DC

## 2019-05-03 ENCOUNTER — Inpatient Hospital Stay (HOSPITAL_COMMUNITY)
Admission: EM | Admit: 2019-05-03 | Discharge: 2019-05-06 | DRG: 435 | Disposition: A | Discharge status: Home / Self Care | Payer: Medicare Other | Attending: Internal Medicine | Admitting: Internal Medicine

## 2019-05-03 ENCOUNTER — Other Ambulatory Visit: Payer: Self-pay

## 2019-05-03 ENCOUNTER — Encounter (HOSPITAL_COMMUNITY): Payer: Self-pay

## 2019-05-03 DIAGNOSIS — Z8711 Personal history of peptic ulcer disease: Secondary | ICD-10-CM

## 2019-05-03 DIAGNOSIS — Z1159 Encounter for screening for other viral diseases: Secondary | ICD-10-CM

## 2019-05-03 DIAGNOSIS — C7800 Secondary malignant neoplasm of unspecified lung: Secondary | ICD-10-CM | POA: Diagnosis present

## 2019-05-03 DIAGNOSIS — E7849 Other hyperlipidemia: Secondary | ICD-10-CM | POA: Diagnosis present

## 2019-05-03 DIAGNOSIS — Z8249 Family history of ischemic heart disease and other diseases of the circulatory system: Secondary | ICD-10-CM

## 2019-05-03 DIAGNOSIS — I2510 Atherosclerotic heart disease of native coronary artery without angina pectoris: Secondary | ICD-10-CM | POA: Diagnosis present

## 2019-05-03 DIAGNOSIS — K8689 Other specified diseases of pancreas: Secondary | ICD-10-CM | POA: Diagnosis not present

## 2019-05-03 DIAGNOSIS — K635 Polyp of colon: Secondary | ICD-10-CM | POA: Diagnosis present

## 2019-05-03 DIAGNOSIS — Z7982 Long term (current) use of aspirin: Secondary | ICD-10-CM

## 2019-05-03 DIAGNOSIS — Z888 Allergy status to other drugs, medicaments and biological substances status: Secondary | ICD-10-CM

## 2019-05-03 DIAGNOSIS — K831 Obstruction of bile duct: Secondary | ICD-10-CM | POA: Diagnosis present

## 2019-05-03 DIAGNOSIS — Z823 Family history of stroke: Secondary | ICD-10-CM

## 2019-05-03 DIAGNOSIS — Z809 Family history of malignant neoplasm, unspecified: Secondary | ICD-10-CM

## 2019-05-03 DIAGNOSIS — C259 Malignant neoplasm of pancreas, unspecified: Secondary | ICD-10-CM | POA: Diagnosis not present

## 2019-05-03 DIAGNOSIS — Z885 Allergy status to narcotic agent status: Secondary | ICD-10-CM

## 2019-05-03 DIAGNOSIS — I700 Atherosclerosis of aorta: Secondary | ICD-10-CM | POA: Diagnosis present

## 2019-05-03 DIAGNOSIS — Z886 Allergy status to analgesic agent status: Secondary | ICD-10-CM

## 2019-05-03 DIAGNOSIS — I10 Essential (primary) hypertension: Secondary | ICD-10-CM | POA: Diagnosis present

## 2019-05-03 DIAGNOSIS — C787 Secondary malignant neoplasm of liver and intrahepatic bile duct: Secondary | ICD-10-CM | POA: Diagnosis present

## 2019-05-03 LAB — COMPREHENSIVE METABOLIC PANEL
ALT: 614 U/L — ABNORMAL HIGH (ref 0–44)
AST: 455 U/L — ABNORMAL HIGH (ref 15–41)
Albumin: 3.8 g/dL (ref 3.5–5.0)
Alkaline Phosphatase: 247 U/L — ABNORMAL HIGH (ref 38–126)
Anion gap: 12 (ref 5–15)
BUN: 15 mg/dL (ref 8–23)
CO2: 25 mmol/L (ref 22–32)
Calcium: 9.5 mg/dL (ref 8.9–10.3)
Chloride: 99 mmol/L (ref 98–111)
Creatinine, Ser: 0.98 mg/dL (ref 0.44–1.00)
GFR calc Af Amer: >60 mL/min (ref >60)
GFR calc non Af Amer: 55 mL/min — ABNORMAL LOW (ref >60)
Glucose, Bld: 123 mg/dL — ABNORMAL HIGH (ref 70–99)
Potassium: 3.6 mmol/L (ref 3.5–5.1)
Sodium: 136 mmol/L (ref 135–145)
Total Bilirubin: 8 mg/dL — ABNORMAL HIGH (ref 0.3–1.2)
Total Protein: 7.1 g/dL (ref 6.5–8.1)

## 2019-05-03 LAB — URINALYSIS, ROUTINE W REFLEX MICROSCOPIC
Bacteria, UA: NONE SEEN
Bilirubin Urine: NEGATIVE
Glucose, UA: NEGATIVE mg/dL
Hgb urine dipstick: NEGATIVE
Ketones, ur: NEGATIVE mg/dL
Nitrite: NEGATIVE
Protein, ur: NEGATIVE mg/dL
Specific Gravity, Urine: 1.02 (ref 1.005–1.030)
pH: 5 (ref 5.0–8.0)

## 2019-05-03 LAB — CBC
HCT: 37.5 % (ref 36.0–46.0)
Hemoglobin: 12.5 g/dL (ref 12.0–15.0)
MCH: 30.8 pg (ref 26.0–34.0)
MCHC: 33.3 g/dL (ref 30.0–36.0)
MCV: 92.4 fL (ref 80.0–100.0)
Platelets: 266 10*3/uL (ref 150–400)
RBC: 4.06 MIL/uL (ref 3.87–5.11)
RDW: 12.9 % (ref 11.5–15.5)
WBC: 6 10*3/uL (ref 4.0–10.5)
nRBC: 0 % (ref 0.0–0.2)

## 2019-05-03 LAB — LIPASE, BLOOD: Lipase: 366 U/L — ABNORMAL HIGH (ref 11–51)

## 2019-05-03 LAB — SARS CORONAVIRUS 2 BY RT PCR (HOSPITAL ORDER, PERFORMED IN ~~LOC~~ HOSPITAL LAB): SARS Coronavirus 2: NEGATIVE

## 2019-05-03 MED ORDER — PIPERACILLIN-TAZOBACTAM 3.375 G IVPB 30 MIN
3.3750 g | Freq: Once | INTRAVENOUS | Status: AC
  Administered 2019-05-03: 3.375 g via INTRAVENOUS
  Filled 2019-05-03: qty 50

## 2019-05-03 MED ORDER — SODIUM CHLORIDE 0.9% FLUSH: 3.0000 mL | Freq: Once | INTRAVENOUS | Status: DC

## 2019-05-03 MED ORDER — WHITE PETROLATUM EX OINT
TOPICAL_OINTMENT | CUTANEOUS | Status: AC
  Administered 2019-05-03: 23:00:00
  Filled 2019-05-03: qty 28.35

## 2019-05-03 NOTE — ED Provider Notes (Addendum)
[facility] EMERGENCY DEPARTMENT Provider Note   CSN: [id number] Arrival date & time: 05/03/19  1530     History   Chief Complaint Chief Complaint  Patient presents with  . Abnormal Lab    HPI Yanci Bachtell is a 78 y.o. female.     HPI  Patient presents with concern of nausea, anorexia, weight loss, and jaundice. Patient was well until a week ago. Symptoms have progressed since that time, and with these concerns she went to her physician. She does have some abdominal pain though it is mild, left lower quadrant, not upper. At her 15 office she was found to have substantial lab abnormalities, was referred for CT, and after that was abnormal she was sent here for evaluation.   Past Medical History:  Diagnosis Date  . Abnormal EKG 06/05/2015   Overview:  stress echo is negative for ischemia Oct 2011  . Abnormal electrocardiography 06/05/2015   Overview:  Overview:  stress echo is negative for ischemia Oct 2011  . Essential hypertension 06/05/2015  . 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 Hallux valgus of right foot 01/16/2016  . 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 Hyperlipidemia 06/05/2015  . Tailor's bunion 01/16/2016    Patient Active Problem List   Diagnosis Date Noted  . Hallux valgus of right foot 01/16/2016  . 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 Tailor's bunion 01/16/2016  . Abnormal EKG 06/05/2015  . Abnormal electrocardiography 06/05/2015  . Essential hypertension 06/05/2015  . 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 Hyperlipidemia 06/05/2015    Past Surgical History:  Procedure Laterality Date  . ABDOMINAL HYSTERECTOMY    . APPENDECTOMY    . KNEE SURGERY       OB History   No obstetric history on file.      Home Medications    Prior to Admission medications   Medication Sig Start Date End Date Taking? Authorizing Provider  aspirin 81 MG tablet Take 1 tablet (81 mg total) by mouth daily. 03/21/18   Richardo Priest, MD   calcium-vitamin D (OSCAL 500/200 D-3) 500-200 MG-UNIT tablet Take 1 tablet by mouth daily.     [provider]  estrogens, conjugated, (PREMARIN) 1.25 MG tablet Take 1.25 mg by mouth daily.    [provider]  furosemide (LASIX) 40 MG tablet Take 1 tablet (40 mg total) by mouth daily. 03/17/19   Richardo Priest, MD  hydroxychloroquine (PLAQUENIL) 200 MG tablet Take 100 mg by mouth 2 (two) times daily. 05/18/17   [provider]  lisinopril (ZESTRIL) 40 MG tablet Take 1 tablet (40 mg total) by mouth 2 (two) times daily. 04/26/19   Richardo Priest, MD  metoprolol tartrate (LOPRESSOR) 50 MG tablet Take 0.5 tablets (25 mg total) by mouth daily. 09/19/18   Richardo Priest, MD  Omega-3 Fatty Acids (FISH OIL PO) Take 1 tablet by mouth 2 (two) times daily.     [provider]  potassium chloride SA (K-DUR,KLOR-CON) 20 MEQ tablet Take 1 tablet (20 mEq total) by mouth daily. 09/19/18   Richardo Priest, MD  ranitidine (ZANTAC) 150 MG tablet Take 150 mg by mouth daily.    [provider]  simvastatin (ZOCOR) 10 MG tablet TAKE 1 TABLET BY MOUTH 3 TIMES A WEEK. ON MONDAY, WEDNESDAY, AND FRIDAY 09/19/18   Richardo Priest, MD    Family History Family History  Problem Relation Age of Onset  . Heart failure Mother   . Stroke Mother   .  Cancer Sister   . Heart attack Brother     Social History Social History   Tobacco Use  . Smoking status: Never Smoker  . Smokeless tobacco: Never Used  Substance Use Topics  . Alcohol use: No  . Drug use: No     Allergies   Hydralazine hcl, Atorvastatin, Celecoxib, Colesevelam, Ezetimibe, Fosinopril, Ibuprofen, Morphine, Oxycodone, Simvastatin, Statins, and Triamcinolone acetonide   Review of Systems Review of Systems  Constitutional:       Per HPI, otherwise negative  HENT:       Per HPI, otherwise negative  Respiratory:       Per HPI, otherwise negative  Cardiovascular:       Per HPI, otherwise negative   Gastrointestinal: Positive for nausea. Negative for vomiting.  Endocrine:       Negative aside from HPI  Genitourinary:       Neg aside from HPI   Musculoskeletal:       Per HPI, otherwise negative  Skin: Positive for color change.  Neurological: Negative for syncope.     Physical Exam Updated Vital Signs BP (!) 159/57 (BP Location: Left Arm)   Pulse 65   Temp 98.7 F (37.1 C) (Oral)   Resp 18   SpO2 95%   Physical Exam Vitals signs and nursing note reviewed.  Constitutional:      General: She is not in acute distress.    Appearance: She is well-developed.  HENT:     Head: Normocephalic and atraumatic.  Eyes:     Conjunctiva/sclera: Conjunctivae normal.  Cardiovascular:     Rate and Rhythm: Normal rate and regular rhythm.  Pulmonary:     Effort: Pulmonary effort is normal. No respiratory distress.     Breath sounds: Normal breath sounds. No stridor.  Abdominal:     General: There is no distension.     Comments: Minimal tenderness to palpation left lower quadrant  Skin:    General: Skin is warm and dry.     Coloration: Skin is jaundiced.  Neurological:     Mental Status: She is alert and oriented to person, place, and time.     Cranial Nerves: No cranial nerve deficit.      ED Treatments / Results  Labs (all labs ordered are listed, but only abnormal results are displayed) Labs Reviewed  LIPASE, BLOOD - Abnormal; Notable for the following components:      Result Value   Lipase 366 (*)    All other components within normal limits  COMPREHENSIVE METABOLIC PANEL - Abnormal; Notable for the following components:   Glucose, Bld 123 (*)    AST 455 (*)    ALT 614 (*)    Alkaline Phosphatase 247 (*)    Total Bilirubin 8.0 (*)    GFR calc non Af Amer 55 (*)    All other components within normal limits  URINALYSIS, ROUTINE W REFLEX MICROSCOPIC - Abnormal; Notable for the following components:   Leukocytes,Ua TRACE (*)    All other components within normal  limits  CBC     Radiology  Images not available, but below are the interpretation of the CT scan performed today.        Procedures Procedures (including critical care time)  Medications Ordered in ED Medications  sodium chloride flush (NS) 0.9 % injection 3 mL (has no administration in time range)  piperacillin-tazobactam (ZOSYN) IVPB 3.375 g (has no administration in time range)     Initial Impression / Assessment and Plan /  ED Course  I have reviewed the triage vital signs and the nursing notes.  Pertinent labs & imaging results that were available during my care of the patient were reviewed by me and considered in my medical decision making (see chart for details).  Patient presents with nausea, weight loss, jaundice. 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  Patient's labs here are similar with those at outside hospital. I discussed her case with her GI physician, Dr. Tarri Glenn, with plan for ERCP tomorrow. Subsequently discussed patient case with our internal medicine colleagues for admission for further monitoring, management.  I discussed the patient's case update with her son. Final Clinical Impressions(s) / ED Diagnoses  Pancreatic mass   Carmin Muskrat, MD 05/03/19 1945    Carmin Muskrat, MD 05/03/19 2000

## 2019-05-03 NOTE — ED Triage Notes (Signed)
Onset 3 days ago abd pain/swelling, itching, dark urine, light BM.   Onset two weeks not feeling well.  Pt was seen at [facility] today for CT abd and lab work.   Dr. Venetia Maxon recommended pt to this ED for ERCP.  Bilibrubin 7.

## 2019-05-03 NOTE — ED Notes (Signed)
ED TO INPATIENT HANDOFF REPORT  ED Nurse Name and Phone #:  [phone]  S Name/Age/Gender Gabriel Rainwater 78 y.o. female Room/Bed: 028C/028C  Code Status   Code Status: Full Code  Home/SNF/Other Home Patient oriented to: self, place, time and situation Is this baseline? Yes   Triage Complete: Triage complete  Chief Complaint jaundice sent by PCP  Triage Note Onset 3 days ago abd pain/swelling, itching, dark urine, light BM.   Onset two weeks not feeling well.  Pt was seen at [facility] today for CT abd and lab work.   Dr. Venetia Maxon recommended pt to this ED for ERCP.  Bilibrubin 7.     Allergies Allergies  Allergen Reactions  . Hydralazine Hcl     Headache   . Atorvastatin Other (See Comments)    "bones ache"  . Celecoxib Other (See Comments)    .  Marland Kitchen Colesevelam Other (See Comments)    .  Marland Kitchen Ezetimibe Other (See Comments)    "bones ache"   . Fosinopril Other (See Comments)    .  Marland Kitchen Ibuprofen Other (See Comments)    "I have bleeding ulcers"  . Morphine Other (See Comments)    ."makes me sleepy"  . Oxycodone Other (See Comments)    Personal preference  . Simvastatin Other (See Comments)    "bones ache"  . Statins Other (See Comments)    "bones ache"  . Triamcinolone Acetonide Other (See Comments)    .    Level of Care/Admitting Diagnosis ED Disposition    ED Disposition Condition Comment   Admit  Hospital Area: [facility] [[id number]]  Level of Care: Med-Surg [16]  I expect the patient will be discharged within 24 hours: Yes  LOW acuity---Tx typically complete <24 hrs---ACUTE conditions typically can be evaluated <24 hours---LABS likely to return to acceptable levels <24 hours---IS near functional baseline---EXPECTED to return to current living arrangement---NOT newly hypoxic: Meets criteria for 5C-Observation unit  Covid Evaluation: Screening Protocol (No Symptoms)  Diagnosis: Pancreatic mass [320391]  Admitting Physician: Shela Leff  [[id number]]  Attending Physician: Shela Leff [[id number]]  PT Class (Do Not Modify): Observation [104]  PT Acc Code (Do Not Modify): Observation [10022]       B Medical/Surgery History Past Medical History:  Diagnosis Date  . Abnormal EKG 06/05/2015   Overview:  stress echo is negative for ischemia Oct 2011  . Abnormal electrocardiography 06/05/2015   Overview:  Overview:  stress echo is negative for ischemia Oct 2011  . Essential hypertension 06/05/2015  . 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 Hallux valgus of right foot 01/16/2016  . 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 Hyperlipidemia 06/05/2015  . 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 ABDOMINAL HYSTERECTOMY    . APPENDECTOMY    . KNEE SURGERY       A IV Location/Drains/Wounds Patient Lines/Drains/Airways Status   Active Line/Drains/Airways    None          Intake/Output Last 24 hours No intake or output data in the 24 hours ending 05/03/19 2030  Labs/Imaging Results for orders placed or performed during the hospital encounter of 05/03/19 (from the past 48 hour(s))  Lipase, blood     Status: Abnormal   Collection Time: 05/03/19  4:40 PM  Result Value Ref Range   Lipase 366 (H) 11 - 51 U/L    Comment: Performed at [facility], [address] [place], [place] [address]  Comprehensive metabolic panel     Status: Abnormal   Collection Time: 05/03/19  4:40 PM  Result Value Ref Range   Sodium 136 135 - 145 mmol/L   Potassium 3.6 3.5 - 5.1 mmol/L   Chloride 99 98 - 111 mmol/L   CO2 25 22 - 32 mmol/L   Glucose, Bld 123 (H) 70 - 99 mg/dL   BUN 15 8 - 23 mg/dL   Creatinine, Ser 0.98 0.44 - 1.00 mg/dL   Calcium 9.5 8.9 - 10.3 mg/dL   Total Protein 7.1 6.5 - 8.1 g/dL   Albumin 3.8 3.5 - 5.0 g/dL   AST 455 (H) 15 - 41 U/L   ALT 614 (H) 0 - 44 U/L   Alkaline Phosphatase 247 (H) 38 - 126 U/L   Total Bilirubin 8.0 (H) 0.3 - 1.2 mg/dL   GFR calc non Af Amer 55 (L) >60  mL/min   GFR calc Af Amer >60 >60 mL/min   Anion gap 12 5 - 15    Comment: Performed at [facility], [address], [place], [place] [address]  CBC     Status: None   Collection Time: 05/03/19  4:40 PM  Result Value Ref Range   WBC 6.0 4.0 - 10.5 K/uL   RBC 4.06 3.87 - 5.11 MIL/uL   Hemoglobin 12.5 12.0 - 15.0 g/dL   HCT 37.5 36.0 - 46.0 %   MCV 92.4 80.0 - 100.0 fL   MCH 30.8 26.0 - 34.0 pg   MCHC 33.3 30.0 - 36.0 g/dL   RDW 12.9 11.5 - 15.5 %   Platelets 266 150 - 400 K/uL   nRBC 0.0 0.0 - 0.2 %    Comment: Performed at [facility], [place] [address] [place], [place] [address]  Urinalysis, Routine w reflex microscopic     Status: Abnormal   Collection Time: 05/03/19  5:03 PM  Result Value Ref Range   Color, Urine YELLOW YELLOW   APPearance CLEAR CLEAR   Specific Gravity, Urine 1.020 1.005 - 1.030   pH 5.0 5.0 - 8.0   Glucose, UA NEGATIVE NEGATIVE mg/dL   Hgb urine dipstick NEGATIVE NEGATIVE   Bilirubin Urine NEGATIVE NEGATIVE   Ketones, ur NEGATIVE NEGATIVE mg/dL   Protein, ur NEGATIVE NEGATIVE mg/dL   Nitrite NEGATIVE NEGATIVE   Leukocytes,Ua TRACE (A) NEGATIVE   RBC / HPF 0-5 0 - 5 RBC/hpf   WBC, UA 0-5 0 - 5 WBC/hpf   Bacteria, UA NONE SEEN NONE SEEN   Squamous Epithelial / LPF 0-5 0 - 5    Comment: Performed at [facility], [place] [address], [place], [place] [address]   No results found.  Pending Labs Unresulted Labs (From admission, onward)    Start     Ordered   05/04/19 0500  Hepatic function panel  Tomorrow morning,   R     05/03/19 2014   05/04/19 0500  Lipase, blood  Tomorrow morning,   R     05/03/19 2014   05/03/19 1947  SARS Coronavirus 2 (CEPHEID - Performed in [place] hospital lab), Hosp Order  (Asymptomatic Patients Labs)  Once,   STAT    Question:  Rule Out  Answer:  Yes   05/03/19 1946          Vitals/Pain Today's Vitals   05/03/19 1616 05/03/19 1703  BP: (!) 159/57   Pulse: 65   Resp: 18   Temp: 98.7 F (37.1 C)  TempSrc: Oral   SpO2: 95%   PainSc:  0-No pain    Isolation Precautions No active isolations  Medications Medications  sodium chloride flush (NS) 0.9 % injection 3 mL (has no administration in time range)  piperacillin-tazobactam (ZOSYN) IVPB 3.375 g (has no administration in time range)    Mobility walks Low fall risk   Focused Assessments Cardiac Assessment Handoff:    No results found for: CKTOTAL, CKMB, CKMBINDEX, TROPONINI No results found for: DDIMER Does the Patient currently have chest pain? No      R Recommendations: See Admitting Provider Note  Report given to:   Additional Notes:

## 2019-05-03 NOTE — H&P (Signed)
History and Physical    Laura Mcintyre WOE:[id number] DOB: 02/02/1941 DOA: 05/03/2019  PCP: Venetia Maxon, Sharon Mt, MD Patient coming from: Home  Chief Complaint: Abnormal labs  HPI: Laura Mcintyre is a 78 y.o. female with medical history significant of hypertension, hyperlipidemia being sent to the hospital by her physician for evaluation of abnormal labs and CT scan findings.  Patient states her daughter has noticed that she has been looking yellow.  States she has lost about 6 to 8 pounds in the past 1.5 weeks.  She has lost her appetite.  3 days ago she experienced abdominal pain and fullness.  No abdominal pain at present.  She has been feeling nauseous but has not vomited.  Her urine is dark in color and her stool appears yellow.  She has not been feeling well.   ED Course: Blood pressure 159/57, remainder of vitals stable.  No leukocytosis.  Lipase 366.  AST 455, ALT 614, alk phos 247, and T bili 8.0.  No prior labs for comparison.  UA not suggestive of infection.  COVID-19 rapid test pending.  Review of Systems:  All systems reviewed and apart from history of presenting illness, are negative.  Past Medical History:  Diagnosis Date  . Abnormal EKG 06/05/2015   Overview:  stress echo is negative for ischemia Oct 2011  . Abnormal electrocardiography 06/05/2015   Overview:  Overview:  stress echo is negative for ischemia Oct 2011  . Essential hypertension 06/05/2015  . 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 Hallux valgus of right foot 01/16/2016  . 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 Hyperlipidemia 06/05/2015  . 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 ABDOMINAL HYSTERECTOMY    . APPENDECTOMY    . KNEE SURGERY       reports that she has never smoked. She has never used smokeless tobacco. She reports that she does not drink alcohol or use drugs.  Allergies  Allergen Reactions  . Hydralazine Hcl     Headache   . Atorvastatin Other (See Comments)     "bones ache"  . Celecoxib Other (See Comments)    .  Marland Kitchen Colesevelam Other (See Comments)    .  Marland Kitchen Ezetimibe Other (See Comments)    "bones ache"   . Fosinopril Other (See Comments)    .  Marland Kitchen Ibuprofen Other (See Comments)    "I have bleeding ulcers"  . Morphine Other (See Comments)    ."makes me sleepy"  . Oxycodone Other (See Comments)    Personal preference  . Simvastatin Other (See Comments)    "bones ache"  . Statins Other (See Comments)    "bones ache"  . Triamcinolone Acetonide Other (See Comments)    .    Family History  Problem Relation Age of Onset  . Heart failure Mother   . Stroke Mother   . Cancer Sister   . Heart attack Brother     Prior to Admission medications   Medication Sig Start Date End Date Taking? Authorizing Provider  aspirin 81 MG tablet Take 1 tablet (81 mg total) by mouth daily. 03/21/18   Richardo Priest, MD  calcium-vitamin D (OSCAL 500/200 D-3) 500-200 MG-UNIT tablet Take 1 tablet by mouth daily.     [provider]  estrogens, conjugated, (PREMARIN) 1.25 MG tablet Take 1.25 mg by mouth daily.    [provider]  furosemide (LASIX) 40 MG tablet Take 1 tablet (40 mg total) by mouth  daily. 03/17/19   Richardo Priest, MD  hydroxychloroquine (PLAQUENIL) 200 MG tablet Take 100 mg by mouth 2 (two) times daily. 05/18/17   [provider]  lisinopril (ZESTRIL) 40 MG tablet Take 1 tablet (40 mg total) by mouth 2 (two) times daily. 04/26/19   Richardo Priest, MD  metoprolol tartrate (LOPRESSOR) 50 MG tablet Take 0.5 tablets (25 mg total) by mouth daily. 09/19/18   Richardo Priest, MD  Omega-3 Fatty Acids (FISH OIL PO) Take 1 tablet by mouth 2 (two) times daily.     [provider]  potassium chloride SA (K-DUR,KLOR-CON) 20 MEQ tablet Take 1 tablet (20 mEq total) by mouth daily. 09/19/18   Richardo Priest, MD  ranitidine (ZANTAC) 150 MG tablet Take 150 mg by mouth daily.    [provider]  simvastatin (ZOCOR) 10 MG  tablet TAKE 1 TABLET BY MOUTH 3 TIMES A WEEK. ON MONDAY, WEDNESDAY, AND FRIDAY 09/19/18   Richardo Priest, MD    Physical Exam: Vitals:   05/03/19 1616  BP: (!) 159/57  Pulse: 65  Resp: 18  Temp: 98.7 F (37.1 C)  TempSrc: Oral  SpO2: 95%    Physical Exam  Constitutional: She is oriented to person, place, and time. She appears well-developed and well-nourished. No distress.  HENT:  Head: Normocephalic.  Mouth/Throat: Oropharynx is clear and moist.  Eyes: Right eye exhibits no discharge. Left eye exhibits no discharge. Scleral icterus is present.  Neck: Neck supple.  Cardiovascular: Normal rate, regular rhythm and intact distal pulses.  Pulmonary/Chest: Effort normal and breath sounds normal. No respiratory distress. She has no wheezes. She has no rales.  Abdominal: Soft. Bowel sounds are normal. She exhibits no distension. There is no abdominal tenderness. There is no guarding.  Musculoskeletal:        General: Edema present.     Comments: +1 pitting edema of bilateral lower extremities (chronic per patient)  Neurological: She is alert and oriented to person, place, and time.  Skin: Skin is warm and dry. She is not diaphoretic.  Appears jaundiced     Labs on Admission: I have personally reviewed following labs and imaging studies  CBC: Recent Labs  Lab 05/03/19 1640  WBC 6.0  HGB 12.5  HCT 37.5  MCV 92.4  PLT 633   Basic Metabolic Panel: Recent Labs  Lab 05/03/19 1640  NA 136  K 3.6  CL 99  CO2 25  GLUCOSE 123*  BUN 15  CREATININE 0.98  CALCIUM 9.5   GFR: CrCl cannot be calculated (Unknown ideal weight.). Liver Function Tests: Recent Labs  Lab 05/03/19 1640  AST 455*  ALT 614*  ALKPHOS 247*  BILITOT 8.0*  PROT 7.1  ALBUMIN 3.8   Recent Labs  Lab 05/03/19 1640  LIPASE 366*   No results for input(s): AMMONIA in the last 168 hours. Coagulation Profile: No results for input(s): INR, PROTIME in the last 168 hours. Cardiac Enzymes: No results  for input(s): CKTOTAL, CKMB, CKMBINDEX, TROPONINI in the last 168 hours. BNP (last 3 results) No results for input(s): PROBNP in the last 8760 hours. HbA1C: No results for input(s): HGBA1C in the last 72 hours. CBG: No results for input(s): GLUCAP in the last 168 hours. Lipid Profile: No results for input(s): CHOL, HDL, LDLCALC, TRIG, CHOLHDL, LDLDIRECT in the last 72 hours. Thyroid Function Tests: No results for input(s): TSH, T4TOTAL, FREET4, T3FREE, THYROIDAB in the last 72 hours. Anemia Panel: No results for input(s): VITAMINB12, FOLATE, FERRITIN,  TIBC, IRON, RETICCTPCT in the last 72 hours. Urine analysis:    Component Value Date/Time   COLORURINE YELLOW 05/03/2019 1703   APPEARANCEUR CLEAR 05/03/2019 1703   LABSPEC 1.020 05/03/2019 1703   PHURINE 5.0 05/03/2019 1703   GLUCOSEU NEGATIVE 05/03/2019 1703   HGBUR NEGATIVE 05/03/2019 1703   BILIRUBINUR NEGATIVE 05/03/2019 1703   KETONESUR NEGATIVE 05/03/2019 1703   PROTEINUR NEGATIVE 05/03/2019 1703   NITRITE NEGATIVE 05/03/2019 1703   LEUKOCYTESUR TRACE (A) 05/03/2019 1703    Radiological Exams on Admission: No results found.  Assessment/Plan Principal Problem:   Pancreatic mass Active Problems:   Pancreatic duct dilated  Pancreatic mass, biliary and pancreatic ductal dilatation, concern for metastatic disease Patient is presenting with complains of nausea, anorexia, and weight loss. Lipase 366.  AST 455, ALT 614, alk phos 247, and T bili 8.0.  No prior labs for comparison.  CT abdomen pelvis done at [facility] showing an ill-defined, hypoenhancing mass in the pancreatic head with associated biliary and pancreatic ductal dilatation (double duot sign), highly suspicious for pancreatic adenocarcinoma.  2 new lesions in the right hepatic lobe suspicious for metastatic disease. 2  left lower lobe pulmonary nodules not seen on prior CT, potentially metastatic disease. -ED provider discussed the case with Dr. Modena Nunnery.  GI planning on  taking the patient for ERCP in the morning. -Keep n.p.o. after midnight -One-time dose of Zosyn in the ED -Consult oncology in the morning -Continue to monitor LFTs and lipase  Unable to safely order home medications at this time as pharmacy medication reconciliation is pending  DVT prophylaxis: SCDs at this time as procedure planned in the morning. Code Status: Patient wishes to be full code. Family Communication: No family available at this time. Disposition Plan: Anticipate discharge after clinical improvement. Consults called: GI Admission status: It is my clinical opinion that referral for OBSERVATION is reasonable and necessary in this patient based on the above information provided. The aforementioned taken together are felt to place the patient at high risk for further clinical deterioration. However it is anticipated that the patient may be medically stable for discharge from the hospital within 24 to 48 hours.  The medical decision making is of moderate complexity, therefore this is a level 2 visit.  Shela Leff MD [facility] Pager [phone]  If 7PM-7AM, please contact night-coverage [email or web address] Password [facility]  05/03/2019, 8:33 PM

## 2019-05-04 DIAGNOSIS — Z7982 Long term (current) use of aspirin: Secondary | ICD-10-CM | POA: Diagnosis not present

## 2019-05-04 DIAGNOSIS — Z885 Allergy status to narcotic agent status: Secondary | ICD-10-CM | POA: Diagnosis not present

## 2019-05-04 DIAGNOSIS — Z1159 Encounter for screening for other viral diseases: Secondary | ICD-10-CM | POA: Diagnosis not present

## 2019-05-04 DIAGNOSIS — I700 Atherosclerosis of aorta: Secondary | ICD-10-CM | POA: Diagnosis present

## 2019-05-04 DIAGNOSIS — Z809 Family history of malignant neoplasm, unspecified: Secondary | ICD-10-CM | POA: Diagnosis not present

## 2019-05-04 DIAGNOSIS — C787 Secondary malignant neoplasm of liver and intrahepatic bile duct: Secondary | ICD-10-CM | POA: Diagnosis present

## 2019-05-04 DIAGNOSIS — I10 Essential (primary) hypertension: Secondary | ICD-10-CM | POA: Diagnosis present

## 2019-05-04 DIAGNOSIS — K635 Polyp of colon: Secondary | ICD-10-CM | POA: Diagnosis present

## 2019-05-04 DIAGNOSIS — Z8249 Family history of ischemic heart disease and other diseases of the circulatory system: Secondary | ICD-10-CM | POA: Diagnosis not present

## 2019-05-04 DIAGNOSIS — R634 Abnormal weight loss: Secondary | ICD-10-CM

## 2019-05-04 DIAGNOSIS — Z886 Allergy status to analgesic agent status: Secondary | ICD-10-CM | POA: Diagnosis not present

## 2019-05-04 DIAGNOSIS — R945 Abnormal results of liver function studies: Secondary | ICD-10-CM | POA: Diagnosis not present

## 2019-05-04 DIAGNOSIS — K831 Obstruction of bile duct: Secondary | ICD-10-CM

## 2019-05-04 DIAGNOSIS — Z888 Allergy status to other drugs, medicaments and biological substances status: Secondary | ICD-10-CM | POA: Diagnosis not present

## 2019-05-04 DIAGNOSIS — I2510 Atherosclerotic heart disease of native coronary artery without angina pectoris: Secondary | ICD-10-CM | POA: Diagnosis present

## 2019-05-04 DIAGNOSIS — C7800 Secondary malignant neoplasm of unspecified lung: Secondary | ICD-10-CM | POA: Diagnosis present

## 2019-05-04 DIAGNOSIS — K8689 Other specified diseases of pancreas: Secondary | ICD-10-CM | POA: Diagnosis present

## 2019-05-04 DIAGNOSIS — Z823 Family history of stroke: Secondary | ICD-10-CM | POA: Diagnosis not present

## 2019-05-04 DIAGNOSIS — E7849 Other hyperlipidemia: Secondary | ICD-10-CM | POA: Diagnosis present

## 2019-05-04 DIAGNOSIS — C259 Malignant neoplasm of pancreas, unspecified: Secondary | ICD-10-CM | POA: Diagnosis present

## 2019-05-04 DIAGNOSIS — Z8711 Personal history of peptic ulcer disease: Secondary | ICD-10-CM | POA: Diagnosis not present

## 2019-05-04 LAB — HEPATIC FUNCTION PANEL
ALT: 586 U/L — ABNORMAL HIGH (ref 0–44)
AST: 431 U/L — ABNORMAL HIGH (ref 15–41)
Albumin: 3.2 g/dL — ABNORMAL LOW (ref 3.5–5.0)
Alkaline Phosphatase: 222 U/L — ABNORMAL HIGH (ref 38–126)
Bilirubin, Direct: 4.9 mg/dL — ABNORMAL HIGH (ref 0.0–0.2)
Indirect Bilirubin: 2.7 mg/dL — ABNORMAL HIGH (ref 0.3–0.9)
Total Bilirubin: 7.6 mg/dL — ABNORMAL HIGH (ref 0.3–1.2)
Total Protein: 6.5 g/dL (ref 6.5–8.1)

## 2019-05-04 LAB — LIPASE, BLOOD: Lipase: 367 U/L — ABNORMAL HIGH (ref 11–51)

## 2019-05-04 LAB — PROTIME-INR
INR: 1.1 (ref 0.8–1.2)
Prothrombin Time: 14 seconds (ref 11.4–15.2)

## 2019-05-04 LAB — MRSA PCR SCREENING: MRSA by PCR: NEGATIVE

## 2019-05-04 MED ORDER — SIMVASTATIN 20 MG PO TABS: 10.0000 mg | ORAL_TABLET | Freq: Every day | ORAL | Status: DC

## 2019-05-04 MED ORDER — HYDROXYCHLOROQUINE SULFATE 200 MG PO TABS
100.0000 mg | ORAL_TABLET | Freq: Two times a day (BID) | ORAL | Status: DC
  Administered 2019-05-04 – 2019-05-06 (×5): 100 mg via ORAL
  Filled 2019-05-04 (×6): qty 1

## 2019-05-04 MED ORDER — OMEGA-3-ACID ETHYL ESTERS 1 G PO CAPS
1.0000 g | ORAL_CAPSULE | Freq: Every day | ORAL | Status: DC
  Administered 2019-05-04 – 2019-05-06 (×3): 1 g via ORAL
  Filled 2019-05-04 (×6): qty 1

## 2019-05-04 MED ORDER — ASPIRIN EC 81 MG PO TBEC: 81.0000 mg | DELAYED_RELEASE_TABLET | Freq: Every day | ORAL | Status: DC

## 2019-05-04 MED ORDER — ADULT MULTIVITAMIN W/MINERALS CH
1.0000 | ORAL_TABLET | Freq: Every day | ORAL | Status: DC
  Administered 2019-05-04 – 2019-05-06 (×3): 1 via ORAL
  Filled 2019-05-04 (×4): qty 1

## 2019-05-04 MED ORDER — DEXTROSE-NACL 5-0.9 % IV SOLN
INTRAVENOUS | Status: DC
  Administered 2019-05-04 – 2019-05-05 (×3): via INTRAVENOUS

## 2019-05-04 MED ORDER — ESTROGENS CONJUGATED 1.25 MG PO TABS
1.2500 mg | ORAL_TABLET | Freq: Every day | ORAL | Status: DC
  Administered 2019-05-04 – 2019-05-05 (×2): 1.25 mg via ORAL
  Filled 2019-05-04 (×4): qty 1

## 2019-05-04 MED ORDER — FAMOTIDINE 20 MG PO TABS
20.0000 mg | ORAL_TABLET | Freq: Two times a day (BID) | ORAL | Status: DC
  Administered 2019-05-04 – 2019-05-06 (×5): 20 mg via ORAL
  Filled 2019-05-04 (×6): qty 1

## 2019-05-04 MED ORDER — INDOMETHACIN 50 MG RE SUPP
100.0000 mg | Freq: Once | RECTAL | Status: DC
  Filled 2019-05-04: qty 2

## 2019-05-04 MED ORDER — POTASSIUM CHLORIDE CRYS ER 20 MEQ PO TBCR
20.0000 meq | EXTENDED_RELEASE_TABLET | Freq: Every day | ORAL | Status: DC
  Administered 2019-05-04 – 2019-05-06 (×3): 20 meq via ORAL
  Filled 2019-05-04 (×4): qty 1

## 2019-05-04 MED ORDER — SODIUM CHLORIDE 0.9 % IV SOLN
3.0000 g | Freq: Once | INTRAVENOUS | Status: DC
  Filled 2019-05-04: qty 8

## 2019-05-04 MED ORDER — ONDANSETRON HCL 4 MG/2ML IJ SOLN
4.0000 mg | Freq: Four times a day (QID) | INTRAMUSCULAR | Status: DC | PRN
  Administered 2019-05-04 – 2019-05-05 (×2): 4 mg via INTRAVENOUS
  Filled 2019-05-04 (×2): qty 2

## 2019-05-04 MED ORDER — METOPROLOL TARTRATE 25 MG PO TABS
25.0000 mg | ORAL_TABLET | Freq: Every day | ORAL | Status: DC
  Administered 2019-05-04 – 2019-05-06 (×3): 25 mg via ORAL
  Filled 2019-05-04 (×4): qty 1

## 2019-05-04 MED ORDER — ENSURE ENLIVE PO LIQD
237.0000 mL | Freq: Two times a day (BID) | ORAL | Status: DC
  Administered 2019-05-04 – 2019-05-06 (×4): 237 mL via ORAL

## 2019-05-04 MED ORDER — SODIUM CHLORIDE 0.9 % IV SOLN: INTRAVENOUS | Status: DC

## 2019-05-04 MED ORDER — IBUPROFEN 600 MG PO TABS
600.0000 mg | ORAL_TABLET | Freq: Four times a day (QID) | ORAL | Status: DC | PRN
  Administered 2019-05-04: 600 mg via ORAL
  Filled 2019-05-04: qty 1

## 2019-05-04 MED ORDER — CALCIUM CARBONATE-VITAMIN D 500-200 MG-UNIT PO TABS
1.0000 | ORAL_TABLET | Freq: Every day | ORAL | Status: DC
  Administered 2019-05-04 – 2019-05-06 (×3): 1 via ORAL
  Filled 2019-05-04 (×4): qty 1

## 2019-05-04 MED ORDER — LISINOPRIL 40 MG PO TABS
40.0000 mg | ORAL_TABLET | Freq: Two times a day (BID) | ORAL | Status: DC
  Administered 2019-05-04 – 2019-05-06 (×5): 40 mg via ORAL
  Filled 2019-05-04 (×6): qty 1

## 2019-05-04 MED ORDER — FUROSEMIDE 40 MG PO TABS
40.0000 mg | ORAL_TABLET | Freq: Every day | ORAL | Status: DC
  Administered 2019-05-04 – 2019-05-05 (×2): 40 mg via ORAL
  Filled 2019-05-04 (×4): qty 1

## 2019-05-04 NOTE — Progress Notes (Signed)
Initial Nutrition Assessment  RD working remotely.  DOCUMENTATION CODES:   Not applicable  INTERVENTION:   -Ensure Enlive po BID, each supplement provides 350 kcal and 20 grams of protein -MVI with minerals daily  NUTRITION DIAGNOSIS:   Inadequate oral intake related to altered GI function as evidenced by per patient/family report, meal completion < 50%.  GOAL:   Patient will meet greater than or equal to 90% of their needs  MONITOR:   PO intake, Supplement acceptance, Labs, Weight trends, Skin, I & O's  REASON FOR ASSESSMENT:   Malnutrition Screening Tool    ASSESSMENT:   Laura Mcintyre is a 78 y.o. female with medical history significant of hypertension, hyperlipidemia being sent to the hospital by her physician for evaluation of abnormal labs and CT scan findings.  Patient states her daughter has noticed that she has been looking yellow.  States she has lost about 6 to 8 pounds in the past 1.5 weeks.  She has lost her appetite.  3 days ago she experienced abdominal pain and fullness.  No abdominal pain at present.  She has been feeling nauseous but has not vomited.  Her urine is dark in color and her stool appears yellow.  She has not been feeling well.  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   Reviewed I/O's: 0 ml x 24 hours  Spoke with pt on the phone, who was in good spirits and talkative today. She reports that she was in her usual state of health up until 10 days ago, where she experienced decreased appetite, early satiety, and generally with no desire to eat. Prior to this, pt reports she consumed 3 meals per day (Breakfast: bisuit with egg and coffee or honey nut cheerios; Lunch: chef salad or tomato sandwich; Dinner: soup or tuna fish). Over the past 10 days, intake became gradually less. She shares when she did not feel like eating, she would consume a beverage or a Boost nutritional drink "which would fill  me up".   Pt had just finished breakfast at time of call; she estimates she consumed approximately half of her meal (50% of coffee and 100% of orange juice, gingerale, and oatmeal). Pt did not eat her sausage due to fear that is would exacerbate her symptoms.   Pt reports her UBW is around 164-168#, which she maintained up until 10 days ago. She estimates she has lost 6-8# over the past 10 days. However, documented wt hx reveals wt stability over the past 7 months.  Per GI notes, plan for ERCP tomorrow. Pt also with a large, complicated colon polyp, which interventions are currently on hold, as her primary GI planned referral and resection at [facility].   Discussed with pt importance of good meal and supplement intake to promote healing. She is amenable to Ensure supplements.     Labs reviewed.   NUTRITION - FOCUSED PHYSICAL EXAM:    Most Recent Value  Orbital Region  Unable to assess  Upper Arm Region  Unable to assess  Thoracic and Lumbar Region  Unable to assess  Buccal Region  Unable to assess  Temple Region  Unable to assess  Clavicle Bone Region  Unable to assess  Clavicle and Acromion Bone Region  Unable to assess  Scapular Bone Region  Unable to assess  Dorsal Hand  Unable to assess  Patellar Region  Unable to assess  Anterior Thigh Region  Unable to assess  Posterior Calf Region  Unable to assess  Edema (RD Assessment)  Unable to assess  Hair  Unable to assess  Eyes  Unable to assess  Mouth  Unable to assess  Skin  Unable to assess  Nails  Unable to assess       Diet Order:   Diet Order            Diet regular Room service appropriate? Yes; Fluid consistency: Thin  Diet effective now              EDUCATION NEEDS:   Education needs have been addressed  Skin:  Skin Assessment: Reviewed RN Assessment  Last BM:  05/04/19  Height:   Ht Readings from Last 1 Encounters:  05/03/19 5\' 5"  (1.651 m)    Weight:   Wt Readings from Last 1 Encounters:  05/03/19 72.1  kg    Ideal Body Weight:  56.8 kg  BMI:  Body mass index is 26.46 kg/m.  Estimated Nutritional Needs:   Kcal:  1700-1900  Protein:  85-100 grams  Fluid:  > 1.7 L    Zalaya Astarita A. Jimmye Norman, RD, LDN, [place] Registered Dietitian II Certified Diabetes Care and Education Specialist Pager: [phone] After hours Pager: [phone]

## 2019-05-04 NOTE — Progress Notes (Addendum)
PROGRESS NOTE    Laura Mcintyre  EPP:[id number] DOB: May 16, 1941 DOA: 05/03/2019 PCP: Street, Sharon Mt, MD   Brief Narrative: Per HPI: 78 y.o. female with medical history significant of hypertension, hyperlipidemia being sent to the hospital by her physician for evaluation of abnormal labs and CT scan findings.  Patient states her daughter has noticed that she has been looking yellow.  States she has lost about 6 to 8 pounds in the past 1.5 weeks.  She has lost her appetite.  3 days ago she experienced abdominal pain and fullness.Her urine is dark in color and her stool appears yellow.  She has not been feeling well.   In ER, VSS, labs-No leukocytosis.  Lipase 366.  AST 455, ALT 614, alk phos 247, and T bili 8.0.  No prior labs for comparison.  UA not suggestive of infection.  COVID-19 rapid test neg. she was admitted for further management after discussion with GI.  Subjective: Complains of mild abdominal discomfort otherwise no nausea vomiting fever or chills.  Appears comfortable.  Assessment & Plan:   Obstructive jaundice with pancreatic mass and biliary and pancreatic duct dilatation in imaging: Given overall clinical presentation imaging findings and lab suspecting pancreatic carcinoma.  GI is consulted and planning for ERCP in the morning.  Continue pain control, IV fluids supportive care.  Status post Zosyn x1 in the ER. Consult oncology once we have tissue diagnosis.  Elevated LFTs due to #1  Large complicated  Colon polyp: Dr. Melina Copa at [facility] had planned a referral and resection at [facility], on hold for now  HLD: holding statin due to elevated LFTs.  Hypertension blood pressure stable on lisinopril.  Hold Lasix for today and resume tomorrow.  hold aspirin for now.  Remote history of PUD and GI bleeding: Avoid NSAIDs.  Inadequate oral intake due to #1.  Dietitian on board, augment nutrition.  DVT prophylaxis: SCD. Hold Heparin/lovenox Code Status: full code Family  Communication: discussed w pa[tietn- daughter was being updated over phone by staff .  Disposition Plan: change to inpatient given need for further workup of pancreatic mass.   Consultants:  GI  Procedures:  Antimicrobials: Anti-infectives (From admission, onward)   Start     Dose/Rate Route Frequency Ordered Stop   05/04/19 1100  hydroxychloroquine (PLAQUENIL) tablet 100 mg     100 mg Oral 2 times daily 05/04/19 0954     05/03/19 1915  piperacillin-tazobactam (ZOSYN) IVPB 3.375 g     3.375 g 100 mL/hr over 30 Minutes Intravenous  Once 05/03/19 1909 05/03/19 2334       Objective: Vitals:   05/03/19 1616 05/03/19 2055  BP: (!) 159/57 (!) 161/64  Pulse: 65 68  Resp: 18 18  Temp: 98.7 F (37.1 C) 98.6 F (37 C)  TempSrc: Oral Oral  SpO2: 95% 100%  Weight:  72.1 kg  Height:  '5\' 5"'$  (1.651 m)    Intake/Output Summary (Last 24 hours) at 05/04/2019 1110 Last data filed at 05/04/2019 0811 Gross per 24 hour  Intake 0 ml  Output -  Net 0 ml   Filed Weights   05/03/19 2055  Weight: 72.1 kg   Weight change:   Body mass index is 26.46 kg/m.  Intake/Output from previous day: No intake/output data recorded. Intake/Output this shift: No intake/output data recorded.  Examination:  General exam: Appears calm and comfortable,Not in distress, older fore the age HEENT:PERRL,Oral mucosa moist, Ear/Nose normal on gross exam Respiratory system: Bilateral equal air entry, normal vesicular breath sounds,  no wheezes or crackles  Cardiovascular system: S1 & S2 heard,No JVD, murmurs. Gastrointestinal system: Abdomen is  soft, non tender, non distended, BS +  Nervous System:Alert and oriented. No focal neurological deficits/moving extremities, sensation intact. Extremities: No edema, no clubbing, distal peripheral pulses palpable. Skin: No rashes, lesions, no icterus MSK: Normal muscle bulk,tone ,power  Medications:  Scheduled Meds: . calcium-vitamin D  1 tablet Oral Daily  .  estrogens (conjugated)  1.25 mg Oral Daily  . famotidine  20 mg Oral BID  . feeding supplement (ENSURE ENLIVE)  237 mL Oral BID BM  . furosemide  40 mg Oral Daily  . hydroxychloroquine  100 mg Oral BID  . lisinopril  40 mg Oral BID  . metoprolol tartrate  25 mg Oral Daily  . multivitamin with minerals  1 tablet Oral Daily  . omega-3 acid ethyl esters  1 g Oral Daily  . potassium chloride SA  20 mEq Oral Daily  . sodium chloride flush  3 mL Intravenous Once   Continuous Infusions: . dextrose 5 % and 0.9% NaCl 75 mL/hr at 05/04/19 6222    Data Reviewed: I have personally reviewed following labs and imaging studies  CBC: Recent Labs  Lab 05/03/19 1640  WBC 6.0  HGB 12.5  HCT 37.5  MCV 92.4  PLT 979   Basic Metabolic Panel: Recent Labs  Lab 05/03/19 1640  NA 136  K 3.6  CL 99  CO2 25  GLUCOSE 123*  BUN 15  CREATININE 0.98  CALCIUM 9.5   GFR: Estimated Creatinine Clearance: 47.1 mL/min (by C-G formula based on SCr of 0.98 mg/dL). Liver Function Tests: Recent Labs  Lab 05/03/19 1640 05/04/19 0506  AST 455* 431*  ALT 614* 586*  ALKPHOS 247* 222*  BILITOT 8.0* 7.6*  PROT 7.1 6.5  ALBUMIN 3.8 3.2*   Recent Labs  Lab 05/03/19 1640 05/04/19 0506  LIPASE 366* 367*   No results for input(s): AMMONIA in the last 168 hours. Coagulation Profile: Recent Labs  Lab 05/04/19 0748  INR 1.1   Cardiac Enzymes: No results for input(s): CKTOTAL, CKMB, CKMBINDEX, TROPONINI in the last 168 hours. BNP (last 3 results) No results for input(s): PROBNP in the last 8760 hours. HbA1C: No results for input(s): HGBA1C in the last 72 hours. CBG: No results for input(s): GLUCAP in the last 168 hours. Lipid Profile: No results for input(s): CHOL, HDL, LDLCALC, TRIG, CHOLHDL, LDLDIRECT in the last 72 hours. Thyroid Function Tests: No results for input(s): TSH, T4TOTAL, FREET4, T3FREE, THYROIDAB in the last 72 hours. Anemia Panel: No results for input(s): VITAMINB12, FOLATE,  FERRITIN, TIBC, IRON, RETICCTPCT in the last 72 hours. Sepsis Labs: No results for input(s): PROCALCITON, LATICACIDVEN in the last 168 hours.  Recent Results (from the past 240 hour(s))  SARS Coronavirus 2 (CEPHEID - Performed in [place] hospital lab), Hosp Order     Status: None   Collection Time: 05/03/19  8:24 PM   Specimen: Nasopharyngeal Swab  Result Value Ref Range Status   SARS Coronavirus 2 NEGATIVE NEGATIVE Final    Comment: (NOTE) If result is NEGATIVE SARS-CoV-2 target nucleic acids are NOT DETECTED. The SARS-CoV-2 RNA is generally detectable in upper and lower  respiratory specimens during the acute phase of infection. The lowest  concentration of SARS-CoV-2 viral copies this assay can detect is 250  copies / mL. A negative result does not preclude SARS-CoV-2 infection  and should not be used as the sole basis for treatment or other  patient management decisions.  A negative result may occur with  improper specimen collection / handling, submission of specimen other  than nasopharyngeal swab, presence of viral mutation(s) within the  areas targeted by this assay, and inadequate number of viral copies  (<250 copies / mL). A negative result must be combined with clinical  observations, patient history, and epidemiological information. If result is POSITIVE SARS-CoV-2 target nucleic acids are DETECTED. The SARS-CoV-2 RNA is generally detectable in upper and lower  respiratory specimens dur ing the acute phase of infection.  Positive  results are indicative of active infection with SARS-CoV-2.  Clinical  correlation with patient history and other diagnostic information is  necessary to determine patient infection status.  Positive results do  not rule out bacterial infection or co-infection with other viruses. If result is PRESUMPTIVE POSTIVE SARS-CoV-2 nucleic acids MAY BE PRESENT.   A presumptive positive result was obtained on the submitted specimen  and confirmed  on repeat testing.  While 2019 novel coronavirus  (SARS-CoV-2) nucleic acids may be present in the submitted sample  additional confirmatory testing may be necessary for epidemiological  and / or clinical management purposes  to differentiate between  SARS-CoV-2 and other Sarbecovirus currently known to infect humans.  If clinically indicated additional testing with an alternate test  methodology [phone]) is advised. The SARS-CoV-2 RNA is generally  detectable in upper and lower respiratory sp ecimens during the acute  phase of infection. The expected result is Negative. Fact Sheet for Patients:  StrictlyIdeas.no Fact Sheet for Healthcare Providers: BankingDealers.co.za This test is not yet approved or cleared by the [place] FDA and has been authorized for detection and/or diagnosis of SARS-CoV-2 by FDA under an Emergency Use Authorization (EUA).  This EUA will remain in effect (meaning this test can be used) for the duration of the COVID-19 declaration under Section 564(b)(1) of the Act, 21 U.S.C. section 360bbb-3(b)(1), unless the authorization is terminated or revoked sooner. Performed at [facility], [place] [address], [place], [place] [address]   MRSA PCR Screening     Status: None   Collection Time: 05/04/19  8:46 AM   Specimen: Nasal Mucosa; Nasopharyngeal  Result Value Ref Range Status   MRSA by PCR NEGATIVE NEGATIVE Final    Comment:        The GeneXpert MRSA Assay (FDA approved for NASAL specimens only), is one component of a comprehensive MRSA colonization surveillance program. It is not intended to diagnose MRSA infection nor to guide or monitor treatment for MRSA infections. Performed at [facility], [place] [address], [place], [place] [address]       Radiology Studies: No results found.    LOS: 0 days   Time spent: More than 50% of that time was spent in counseling and/or coordination of care.   Antonieta Pert, MD [facility]  05/04/2019, 11:10 AM

## 2019-05-04 NOTE — Consult Note (Addendum)
[facility] Consult: 8:09 AM 05/04/2019  LOS: 0 days    Referring Provider: Dr Maren Beach  Primary Care Physician:  Street, Sharon Mt, MD Primary Gastroenterologist: Dr. Nehemiah Settle in [place].    Reason for Consultation: Obstructive jaundice, pancreatic and liver masses   HPI: Laura Mcintyre is a 78 y.o. female.  Hx htn.  HLD.  Colon polyps.  History of bleeding peptic ulcers.  S/p laparoscopic appendectomy ~ 2014 Patient has had 2 colonoscopies within the past 12 months.  At the first colonoscopy sounds like she had a partial resection of a large polyp along with placement of Endo Clip.  At relook colonoscopy, the polyp had "wrapped around the clip" and Dr. Melina Copa did not feel comfortable resecting this.  He referred her to GI at [facility] to address the polypectomy.  10 days of nausea without emesis, anorexia, 6 to 8 pound weight loss, malaise, fatigue.  Pain across the mid abdomen developed on Sunday, the waistband of her underwear and slacks cause discomfort in the area.  Also developed pruritus around that time.  Urine was dark.  Daughter noticed jaundice on Tuesday.  Daughter noticed this during the patient's virtual visit with GI at [facility] Tuesday.  After contacting her PMD, Dr. Dema Severin ordered CT scan and labs.  LFTs were elevated.   CTAP showed 3 x 2.3 sonometer mass at head of pancreas suspicious for adenocarcinoma.  Mass causing dilation of biliary and pancreatic duct.  Pancreatic duct 4 mm, common hepatic duct 12 mm, lesions in the right hepatic lobe suspicious for mets.  2 nodules in left lower lobe of lung potentially metastatic.  T bili 8.  Alk phos 247.  AST/ALT 455/614.  CBC, electrolytes ok.   Lipase 367  Past Medical History:  Diagnosis Date  . Abnormal EKG 06/05/2015   Overview:  stress  echo is negative for ischemia Oct 2011  . Abnormal electrocardiography 06/05/2015   Overview:  Overview:  stress echo is negative for ischemia Oct 2011  . Essential hypertension 06/05/2015  . 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 Hallux valgus of right foot 01/16/2016  . 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 Hyperlipidemia 06/05/2015  . 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 ABDOMINAL HYSTERECTOMY    . APPENDECTOMY    . KNEE SURGERY      Prior to Admission medications   Medication Sig Start Date End Date Taking? Authorizing Provider  aspirin 81 MG tablet Take 1 tablet (81 mg total) by mouth daily. Patient taking differently: Take 81 mg by mouth at bedtime.  03/21/18  Yes Richardo Priest, MD  calcium-vitamin D (OSCAL 500/200 D-3) 500-200 MG-UNIT tablet Take 1 tablet by mouth daily.    Yes [provider]  estrogens, conjugated, (PREMARIN) 1.25 MG tablet Take 1.25 mg by mouth daily.   Yes [provider]  furosemide (LASIX) 40 MG tablet Take 1 tablet (40 mg total) by mouth daily. 03/17/19  Yes Richardo Priest, MD  hydroxychloroquine (PLAQUENIL) 200 MG tablet Take  100 mg by mouth 2 (two) times daily. 05/18/17  Yes [provider]  lisinopril (ZESTRIL) 40 MG tablet Take 1 tablet (40 mg total) by mouth 2 (two) times daily. Patient taking differently: Take 40 mg by mouth See admin instructions. Take '40mg'$  in the morning and '20mg'$  at bedtime 04/26/19  Yes Richardo Priest, MD  metoprolol tartrate (LOPRESSOR) 50 MG tablet Take 0.5 tablets (25 mg total) by mouth daily. 09/19/18  Yes Richardo Priest, MD  Omega-3 Fatty Acids (FISH OIL PO) Take 1 tablet by mouth 2 (two) times daily.    Yes [provider]  potassium chloride SA (K-DUR,KLOR-CON) 20 MEQ tablet Take 1 tablet (20 mEq total) by mouth daily. 09/19/18  Yes Richardo Priest, MD  simvastatin (ZOCOR) 10 MG tablet TAKE 1 TABLET BY MOUTH 3 TIMES A WEEK. ON MONDAY, WEDNESDAY, AND  FRIDAY 09/19/18  Yes Richardo Priest, MD    Scheduled Meds: . sodium chloride flush  3 mL Intravenous Once   Infusions:  PRN Meds:    Allergies as of 05/03/2019 - Review Complete 05/03/2019  Allergen Reaction Noted  . Aloe Other (See Comments) 04/28/2019  . Hydralazine hcl  09/28/2018  . Atorvastatin Other (See Comments) 12/30/2015  . Celecoxib Other (See Comments) 12/30/2015  . Colesevelam Other (See Comments) 12/30/2015  . Ezetimibe Other (See Comments) 12/30/2015  . Fosinopril Other (See Comments) 12/30/2015  . Ibuprofen Other (See Comments) 12/30/2015  . Morphine Other (See Comments) 12/30/2015  . Oxycodone Other (See Comments) 12/30/2015  . Simvastatin Other (See Comments) 12/30/2015  . Statins Other (See Comments) 12/30/2015  . Triamcinolone acetonide Other (See Comments) 12/30/2015    Family History  Problem Relation Age of Onset  . Heart failure Mother   . Stroke Mother   . Cancer Sister   . Heart attack Brother     Social History   Socioeconomic History  . Marital status: Legally Separated    Spouse name: Not on file  . Number of children: Not on file  . Years of education: Not on file  . Highest education level: Not on file  Occupational History  . Not on file  Social Needs  . Financial resource strain: Not on file  . Food insecurity    Worry: Not on file    Inability: Not on file  . Transportation needs    Medical: Not on file    Non-medical: Not on file  Tobacco Use  . Smoking status: Never Smoker  . Smokeless tobacco: Never Used  Substance and Sexual Activity  . Alcohol use: No  . Drug use: No  . Sexual activity: Not on file  Lifestyle  . Physical activity    Days per week: Not on file    Minutes per session: Not on file  . Stress: Not on file  Relationships  . Social Herbalist on phone: Not on file    Gets together: Not on file    Attends religious service: Not on file    Active member of club or organization: Not on  file    Attends meetings of clubs or organizations: Not on file    Relationship status: Not on file  . Intimate partner violence    Fear of current or ex partner: Not on file    Emotionally abused: Not on file    Physically abused: Not on file    Forced sexual activity: Not on file  Other Topics Concern  . Not on  file  Social History Narrative  . Not on file    REVIEW OF SYSTEMS: Constitutional: Generally patient is fit and active but recently experiencing fatigue.  She believes she has lost "6 to 8 pounds" in the last month or so. ENT:  No nose bleeds Pulm: No shortness of breath, no cough. CV:  No palpitations, no LE edema.  No chest pain. GU:  No hematuria, no frequency GI: See HPI.  Patient previously took Zantac, when she ran out of it and it was no longer available, her doctor switched her to famotidine but that was around the same time she started developing the nausea 10 days ago so she stopped the famotidine.  No dysphagia.  No heartburn.  Ends to constipation and treats this with daily Metamucil. Heme: Denies unusual bleeding or bruising. Transfusions: None recently. Neuro:  No headaches, no peripheral tingling or numbness.  No syncope.  No seizures. Psych: Occasional panic attacks, uses as needed meds when these occur. Derm: No rash, no sores.  Pruritus and jaundice per HPI. Endocrine:  No sweats or chills.  No polyuria or dysuria Immunization: Not queried. Travel:  None recently.   PHYSICAL EXAM: Vital signs in last 24 hours: Vitals:   05/03/19 1616 05/03/19 2055  BP: (!) 159/57 (!) 161/64  Pulse: 65 68  Resp: 18 18  Temp: 98.7 F (37.1 C) 98.6 F (37 C)  SpO2: 95% 100%   Wt Readings from Last 3 Encounters:  05/03/19 72.1 kg  09/28/18 71.7 kg  03/21/18 74.4 kg    General: Pleasant,  Comfortable, elderly woman. + Jaundiced. Head: No facial asymmetry or swelling.  No signs of head trauma. Eyes: Positive for scleral icterus.  No conjunctival pallor.  EOMI.  Ears: Not hard of hearing Nose: No congestion or discharge. Mouth: Tongue midline.  Full upper dentures, lower partial denture.  Oral mucosa pink, moist, clear. Neck: No JVD, no masses, no thyromegaly. Lungs: Clear bilaterally though somewhat reduced in the upper lobes.  No labored breathing, no cough. Heart: RRR.  No MRG.  S1, S2 present. Abdomen: Soft.  Mild epigastric tenderness, not distended.  Vague ridge of tissue in the epigastric region which is not tender.  Bowel sounds active.  No HSM, bruits, hernias..   Rectal: Deferred Musc/Skeltl: No joint redness, swelling or gross deformity. Extremities: No CCE. Neurologic: Oriented x3.  Moves all 4 limbs with full strength.  No tremor.  No gross deficits. Skin: Jaundiced. Tattoos: None   Psych: Talkative, slightly anxious, pleasant affect, cooperative.  Intake/Output from previous day: No intake/output data recorded. Intake/Output this shift: No intake/output data recorded.  LAB RESULTS: Recent Labs    05/03/19 1640  WBC 6.0  HGB 12.5  HCT 37.5  PLT 266   BMET Lab Results  Component Value Date   NA 136 05/03/2019   K 3.6 05/03/2019   CL 99 05/03/2019   CO2 25 05/03/2019   GLUCOSE 123 (H) 05/03/2019   BUN 15 05/03/2019   CREATININE 0.98 05/03/2019   CALCIUM 9.5 05/03/2019   LFT Recent Labs    05/03/19 1640 05/04/19 0506  PROT 7.1 6.5  ALBUMIN 3.8 3.2*  AST 455* 431*  ALT 614* 586*  ALKPHOS 247* 222*  BILITOT 8.0* 7.6*  BILIDIR  --  4.9*  IBILI  --  2.7*   Lipase     Component Value Date/Time   LIPASE 367 (H) 05/04/2019 0506     COVID test negative  RADIOLOGY STUDIES: No results found.  IMPRESSION:   *   Obstructive  jaundice.  High suspicion for pancreatic cancer with mets to liver and possibly to lung. Needs ERCP  *   Large, complicated colon polyp.  Dr. Melina Copa in [place] had planned referral and resection at [facility]  This is now on hold.  *    Remote PUD with GI bleeding.    PLAN:      *    ERCP  tomorrow.  Timing to be determined.  *   Regular diet today, n.p.o. after midnight  *   Added Famotidine.     Azucena Freed  05/04/2019, 8:09 AM Phone [phone]  I have reviewed the entire case in detail with the above APP and discussed the plan in detail.  Therefore, I agree with the diagnoses recorded above. In addition,  I have personally interviewed and examined the patient.  I was unable to personally review the CT images, as they were done at an outside facility.  My additional thoughts are as follows:  Obstructive jaundice with recent weight loss, pancreatic mass and both liver and lung lesions suspicious for metastasis suspected underlying malignancy.  I discussed all this with her at length, our clinical suspicion, need for ERCP.  ERCP was described in detail along with a diagram of the anatomy, risks and benefits reviewed.  The benefits and risks of the planned procedure were described in detail with the patient or (when appropriate) their health care proxy.  Risks were outlined as including, but not limited to, bleeding, infection, perforation, adverse medication reaction leading to cardiac or pulmonary decompensation.  The limitation of incomplete mucosal visualization was also discussed.  Risk of ERCP pancreatitis also reviewed.  No guarantees or warranties were given. She was agreeable to proceed.  Our tentative plan is for this to be done tomorrow with Dr. Lizbeth Bark, with whom I will discuss the case in detail later today.  CA 19-9 ordered for tomorrow morning.  Nelida Meuse III Office:[phone]

## 2019-05-05 ENCOUNTER — Encounter (HOSPITAL_COMMUNITY)
Admission: EM | Disposition: A | Discharge status: Home / Self Care | Payer: Self-pay | Source: Home / Self Care | Attending: Internal Medicine

## 2019-05-05 ENCOUNTER — Inpatient Hospital Stay (HOSPITAL_COMMUNITY): Payer: Medicare Other | Admitting: Certified Registered"

## 2019-05-05 ENCOUNTER — Encounter (HOSPITAL_COMMUNITY): Payer: Self-pay | Admitting: *Deleted

## 2019-05-05 ENCOUNTER — Inpatient Hospital Stay (HOSPITAL_COMMUNITY): Payer: Medicare Other

## 2019-05-05 HISTORY — PX: ENDOSCOPIC RETROGRADE CHOLANGIOPANCREATOGRAPHY (ERCP) WITH PROPOFOL: SHX5810

## 2019-05-05 HISTORY — PX: BILIARY BRUSHING: SHX6843

## 2019-05-05 HISTORY — PX: BILIARY STENT PLACEMENT: SHX5538

## 2019-05-05 HISTORY — PX: SPHINCTEROTOMY: SHX5544

## 2019-05-05 HISTORY — PX: PANCREATIC STENT PLACEMENT: SHX5539

## 2019-05-05 SURGERY — ENDOSCOPIC RETROGRADE CHOLANGIOPANCREATOGRAPHY (ERCP) WITH PROPOFOL
Anesthesia: General

## 2019-05-05 MED ORDER — ROCURONIUM BROMIDE 50 MG/5ML IV SOSY
PREFILLED_SYRINGE | INTRAVENOUS | Status: DC | PRN
  Administered 2019-05-05: 50 mg via INTRAVENOUS

## 2019-05-05 MED ORDER — GLUCAGON HCL RDNA (DIAGNOSTIC) 1 MG IJ SOLR
INTRAMUSCULAR | Status: DC | PRN
  Administered 2019-05-05: .5 mg via INTRAVENOUS

## 2019-05-05 MED ORDER — SUCCINYLCHOLINE CHLORIDE 200 MG/10ML IV SOSY
PREFILLED_SYRINGE | INTRAVENOUS | Status: DC | PRN
  Administered 2019-05-05: 80 mg via INTRAVENOUS

## 2019-05-05 MED ORDER — INDOMETHACIN 50 MG RE SUPP
RECTAL | Status: DC | PRN
  Administered 2019-05-05: 100 mg via RECTAL

## 2019-05-05 MED ORDER — GLUCAGON HCL RDNA (DIAGNOSTIC) 1 MG IJ SOLR
INTRAMUSCULAR | Status: AC
  Filled 2019-05-05: qty 1

## 2019-05-05 MED ORDER — SODIUM CHLORIDE 0.9 % IV SOLN
INTRAVENOUS | Status: DC | PRN
  Administered 2019-05-05: 25 mL

## 2019-05-05 MED ORDER — FENTANYL CITRATE (PF) 250 MCG/5ML IJ SOLN
INTRAMUSCULAR | Status: DC | PRN
  Administered 2019-05-05: 50 ug via INTRAVENOUS

## 2019-05-05 MED ORDER — DEXAMETHASONE SODIUM PHOSPHATE 10 MG/ML IJ SOLN
INTRAMUSCULAR | Status: DC | PRN
  Administered 2019-05-05: 10 mg via INTRAVENOUS

## 2019-05-05 MED ORDER — METOPROLOL TARTRATE 5 MG/5ML IV SOLN
INTRAVENOUS | Status: AC
  Filled 2019-05-05: qty 5

## 2019-05-05 MED ORDER — CIPROFLOXACIN IN D5W 400 MG/200ML IV SOLN
INTRAVENOUS | Status: AC
  Filled 2019-05-05: qty 200

## 2019-05-05 MED ORDER — ACETAMINOPHEN 325 MG PO TABS
650.0000 mg | ORAL_TABLET | Freq: Four times a day (QID) | ORAL | Status: DC | PRN
  Administered 2019-05-05: 650 mg via ORAL
  Filled 2019-05-05: qty 2

## 2019-05-05 MED ORDER — SODIUM CHLORIDE 0.9 % IV SOLN
INTRAVENOUS | Status: DC | PRN
  Administered 2019-05-05: 50 ug/min via INTRAVENOUS

## 2019-05-05 MED ORDER — METOPROLOL TARTRATE 5 MG/5ML IV SOLN
5.0000 mg | Freq: Once | INTRAVENOUS | Status: AC
  Administered 2019-05-05: 5 mg via INTRAVENOUS

## 2019-05-05 MED ORDER — CIPROFLOXACIN IN D5W 400 MG/200ML IV SOLN
INTRAVENOUS | Status: DC | PRN
  Administered 2019-05-05: 400 mg via INTRAVENOUS

## 2019-05-05 MED ORDER — LIDOCAINE 2% (20 MG/ML) 5 ML SYRINGE
INTRAMUSCULAR | Status: DC | PRN
  Administered 2019-05-05: 80 mg via INTRAVENOUS

## 2019-05-05 MED ORDER — METHOCARBAMOL 500 MG PO TABS
500.0000 mg | ORAL_TABLET | Freq: Once | ORAL | Status: AC
  Administered 2019-05-05: 500 mg via ORAL
  Filled 2019-05-05: qty 1

## 2019-05-05 MED ORDER — ONDANSETRON HCL 4 MG/2ML IJ SOLN
INTRAMUSCULAR | Status: DC | PRN
  Administered 2019-05-05: 4 mg via INTRAVENOUS

## 2019-05-05 MED ORDER — INDOMETHACIN 50 MG RE SUPP
RECTAL | Status: AC
  Filled 2019-05-05: qty 2

## 2019-05-05 MED ORDER — PROPOFOL 10 MG/ML IV BOLUS
INTRAVENOUS | Status: DC | PRN
  Administered 2019-05-05: 110 mg via INTRAVENOUS

## 2019-05-05 MED ORDER — LACTATED RINGERS IV SOLN
INTRAVENOUS | Status: DC
  Administered 2019-05-05: 1000 mL via INTRAVENOUS

## 2019-05-05 MED ORDER — FENTANYL CITRATE (PF) 100 MCG/2ML IJ SOLN
INTRAMUSCULAR | Status: AC
  Filled 2019-05-05: qty 2

## 2019-05-05 MED ORDER — SUGAMMADEX SODIUM 200 MG/2ML IV SOLN
INTRAVENOUS | Status: DC | PRN
  Administered 2019-05-05: 200 mg via INTRAVENOUS

## 2019-05-05 NOTE — Progress Notes (Signed)
PROGRESS NOTE    Laura Mcintyre  EVO:[id number] DOB: 1941-07-07 DOA: 05/03/2019 PCP: Street, Sharon [place], MD   Brief Narrative: Per HPI: 78 y.o. female with medical history significant of hypertension, hyperlipidemia being sent to the hospital by her physician for evaluation of abnormal labs and CT scan findings.  Patient states her daughter has noticed that she has been looking yellow.  States she has lost about 6 to 8 pounds in the past 1.5 weeks.  She has lost her appetite.  3 days ago she experienced abdominal pain and fullness.Her urine is dark in color and her stool appears yellow.  She has not been feeling well.   In ER, VSS, labs-No leukocytosis.  Lipase 366.  AST 455, ALT 614, alk phos 247, and T bili 8.0.  No prior labs for comparison.  UA not suggestive of infection.  COVID-19 rapid test neg. she was admitted for further management after discussion with GI. Patient was admitted. Underwent ERCP 7/3  Subjective: Had ercp today. On bed- sleepy. Denies nausea vomiting or abdominal pain. " Doctor told me I will go home tomorrow"  Assessment & Plan:   Obstructive jaundice with pancreatic mass and biliary and pancreatic duct dilatation in imaging: Given overall clinical presentation imaging findings and lab suspecting pancreatic carcinoma.  GI is consulted and s/p ERCP:, Localized biliary stricture was found lower third of the main bile duct, sphincterotomy and brushing done with stenting continue clear liquid diet for next 6 hours, follow-up cytology results and repeat CA-19-9 once bili back to normal  AND IF "cytology negative might want to proceed with liver biopsy of mass to confirm metastatic disease and might need chest CT as well to evaluate lung lesions". Obtain CT chest. Cont pain control  Elevated LFTs due to #1  Large complicated  Colon polyp: Dr. Melina Copa at [facility] had planned a referral and resection at [facility], on hold for now  HLD: holding statin due to elevated LFTs.   Hypertension blood pressure poorly controlled continue lisinopril, metoprolol, lasix.Monitor and adjust medication.    Remote history of PUD and GI bleeding: Avoid NSAIDs.  Inadequate oral intake due to #1.  Dietitian on board, augment nutrition.  DVT prophylaxis: SCD. Hold Heparin/lovenox Code Status: full code Family Communication: discussed w patient. Disposition Plan:  Remains inpatient pending clinical improvement.  Anticipate discharge in the morning once okay with GI.Paged Dr. Watt Climes from GI to discuss more.  Consultants:  GI  Procedures:  ERCP  The major papilla appeared congested. - A single localized biliary stricture was found in the lower third of the main bile duct. 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 This stricture was treated with biliary sphincterotomy brushing and stenting. - One plastic stent was placed into the ventral pancreatic duct. - A sphincterotomy was performed. Impression: -The major papilla appeared congested - A single localized biliary stricture was found in the lower third of the main bile duct. 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 This stricture was treated with biliary sphincterotomy brushing and stenting. - One plastic stent was placed into the ventral pancreatic duct. - A sphincterotomy was performed. - Cells for cytology obtained in the lower third of the main duct. - One uncovered metal stent was placed into the common bile duct. Recommendations: - Clear liquid diet for 6 hours. - Continue present medications. - Await cytology results. May want to repeat CA 19 when bili back to normal - Return to GI clinic PRN. If cytology negative might want to proceed with liver biopsy  of mass to confirm metastatic disease and might need chest CT as well to evaluate lung lesions - Telephone GI clinic for pathology results in 4 days. - Telephone GI clinic if symptomatic PRN. Will need abdominal x-ray in 1 to 2 weeks to confirm pancreatic duct stent  passing on its own otherwise might need endoscopic removal at that time  Antimicrobials: Anti-infectives (From admission, onward)   Start     Dose/Rate Route Frequency Ordered Stop   05/05/19 0900  Ampicillin-Sulbactam (UNASYN) 3 g in sodium chloride 0.9 % 100 mL IVPB  Status:  Discontinued     3 g 200 mL/hr over 30 Minutes Intravenous  Once 05/04/19 1824 05/05/19 1113   05/04/19 1100  hydroxychloroquine (PLAQUENIL) tablet 100 mg     100 mg Oral 2 times daily 05/04/19 0954     05/03/19 1915  piperacillin-tazobactam (ZOSYN) IVPB 3.375 g     3.375 g 100 mL/hr over 30 Minutes Intravenous  Once 05/03/19 1909 05/03/19 2334       Objective: Vitals:   05/05/19 1050 05/05/19 1100 05/05/19 1110 05/05/19 1126  BP: (!) 186/59 (!) 188/59 (!) 191/69 (!) 182/71  Pulse: 60 (!) 56 (!) 55 (!) 57  Resp: _0 Temp:    98.3 F (36.8 C)  TempSrc:      SpO2: 97% 93% 94% 96%  Weight:      Height:        Intake/Output Summary (Last 24 hours) at 05/05/2019 1329 Last data filed at 05/05/2019 0847 Gross per 24 hour  Intake 396.13 ml  Output 400 ml  Net -3.87 ml   Filed Weights   05/03/19 2055 05/05/19 0828  Weight: 72.1 kg 72.1 kg   Weight change:   Body mass index is 26.45 kg/m.  Intake/Output from previous day: 07/02 0701 - 07/03 0700 In: 1516.1 [P.O.:1120; I.V.:396.1] Out: 400 [Urine:400] Intake/Output this shift: No intake/output data recorded.  Examination:  General exam: Sleepy, not in acute distress.   HEENT:PERRL,Oral mucosa moist, Ear/Nose normal on gross exam Respiratory system: Bilateral equal air entry, normal vesicular breath sounds, no wheezes or crackles  Cardiovascular system: S1 & S2 heard,No JVD, murmurs. Gastrointestinal system: Abdomen is  soft, mildly tender on mid abdomen, non distended, BS +  Nervous System: Sleepy.No focal neurological deficits/moving extremities, sensation intact. Extremities: No edema, no clubbing, distal peripheral pulses palpable.  Skin: No rashes, lesions, no icterus MSK: Normal muscle bulk,tone ,power  Medications:  Scheduled Meds: . calcium-vitamin D  1 tablet Oral Daily  . estrogens (conjugated)  1.25 mg Oral Daily  . famotidine  20 mg Oral BID  . feeding supplement (ENSURE ENLIVE)  237 mL Oral BID BM  . furosemide  40 mg Oral Daily  . hydroxychloroquine  100 mg Oral BID  . indomethacin  100 mg Rectal Once  . indomethacin  100 mg Rectal Once  . lisinopril  40 mg Oral BID  . metoprolol tartrate  25 mg Oral Daily  . multivitamin with minerals  1 tablet Oral Daily  . omega-3 acid ethyl esters  1 g Oral Daily  . potassium chloride SA  20 mEq Oral Daily  . sodium chloride flush  3 mL Intravenous Once   Continuous Infusions: . dextrose 5 % and 0.9% NaCl 75 mL/hr at 05/04/19 2136  . lactated ringers Stopped (05/05/19 1112)    Data Reviewed: I have personally reviewed following labs and imaging studies  CBC: Recent Labs  Lab 05/03/19 1640  WBC 6.0  HGB 12.5  HCT 37.5  MCV 92.4  PLT 591   Basic Metabolic Panel: Recent Labs  Lab 05/03/19 1640  NA 136  K 3.6  CL 99  CO2 25  GLUCOSE 123*  BUN 15  CREATININE 0.98  CALCIUM 9.5   GFR: Estimated Creatinine Clearance: 47.1 mL/min (by C-G formula based on SCr of 0.98 mg/dL). Liver Function Tests: Recent Labs  Lab 05/03/19 1640 05/04/19 0506  AST 455* 431*  ALT 614* 586*  ALKPHOS 247* 222*  BILITOT 8.0* 7.6*  PROT 7.1 6.5  ALBUMIN 3.8 3.2*   Recent Labs  Lab 05/03/19 1640 05/04/19 0506  LIPASE 366* 367*   No results for input(s): AMMONIA in the last 168 hours. Coagulation Profile: Recent Labs  Lab 05/04/19 0748  INR 1.1   Cardiac Enzymes: No results for input(s): CKTOTAL, CKMB, CKMBINDEX, TROPONINI in the last 168 hours. BNP (last 3 results) No results for input(s): PROBNP in the last 8760 hours. HbA1C: No results for input(s): HGBA1C in the last 72 hours. CBG: No results for input(s): GLUCAP in the last 168 hours. Lipid  Profile: No results for input(s): CHOL, HDL, LDLCALC, TRIG, CHOLHDL, LDLDIRECT in the last 72 hours. Thyroid Function Tests: No results for input(s): TSH, T4TOTAL, FREET4, T3FREE, THYROIDAB in the last 72 hours. Anemia Panel: No results for input(s): VITAMINB12, FOLATE, FERRITIN, TIBC, IRON, RETICCTPCT in the last 72 hours. Sepsis Labs: No results for input(s): PROCALCITON, LATICACIDVEN in the last 168 hours.  Recent Results (from the past 240 hour(s))  SARS Coronavirus 2 (CEPHEID - Performed in [place] hospital lab), Hosp Order     Status: None   Collection Time: 05/03/19  8:24 PM   Specimen: Nasopharyngeal Swab  Result Value Ref Range Status   SARS Coronavirus 2 NEGATIVE NEGATIVE Final    Comment: (NOTE) If result is NEGATIVE SARS-CoV-2 target nucleic acids are NOT DETECTED. The SARS-CoV-2 RNA is generally detectable in upper and lower  respiratory specimens during the acute phase of infection. The lowest  concentration of SARS-CoV-2 viral copies this assay can detect is 250  copies / mL. A negative result does not preclude SARS-CoV-2 infection  and should not be used as the sole basis for treatment or other  patient management decisions.  A negative result may occur with  improper specimen collection / handling, submission of specimen other  than nasopharyngeal swab, presence of viral mutation(s) within the  areas targeted by this assay, and inadequate number of viral copies  (<250 copies / mL). A negative result must be combined with clinical  observations, patient history, and epidemiological information. If result is POSITIVE SARS-CoV-2 target nucleic acids are DETECTED. The SARS-CoV-2 RNA is generally detectable in upper and lower  respiratory specimens dur ing the acute phase of infection.  Positive  results are indicative of active infection with SARS-CoV-2.  Clinical  correlation with patient history and other diagnostic information is  necessary to determine patient  infection status.  Positive results do  not rule out bacterial infection or co-infection with other viruses. If result is PRESUMPTIVE POSTIVE SARS-CoV-2 nucleic acids MAY BE PRESENT.   A presumptive positive result was obtained on the submitted specimen  and confirmed on repeat testing.  While 2019 novel coronavirus  (SARS-CoV-2) nucleic acids may be present in the submitted sample  additional confirmatory testing may be necessary for epidemiological  and / or clinical management purposes  to differentiate between  SARS-CoV-2 and other Sarbecovirus currently known to infect humans.  If clinically  indicated additional testing with an alternate test  methodology [phone]) is advised. The SARS-CoV-2 RNA is generally  detectable in upper and lower respiratory sp ecimens during the acute  phase of infection. The expected result is Negative. Fact Sheet for Patients:  StrictlyIdeas.no Fact Sheet for Healthcare Providers: BankingDealers.co.za This test is not yet approved or cleared by the [place] FDA and has been authorized for detection and/or diagnosis of SARS-CoV-2 by FDA under an Emergency Use Authorization (EUA).  This EUA will remain in effect (meaning this test can be used) for the duration of the COVID-19 declaration under Section 564(b)(1) of the Act, 21 U.S.C. section 360bbb-3(b)(1), unless the authorization is terminated or revoked sooner. Performed at [facility], [place] [address], [place], [place] [address]   MRSA PCR Screening     Status: None   Collection Time: 05/04/19  8:46 AM   Specimen: Nasal Mucosa; Nasopharyngeal  Result Value Ref Range Status   MRSA by PCR NEGATIVE NEGATIVE Final    Comment:        The GeneXpert MRSA Assay (FDA approved for NASAL specimens only), is one component of a comprehensive MRSA colonization surveillance program. It is not intended to diagnose MRSA infection nor to guide or  monitor treatment for MRSA infections. Performed at [facility], [place] [address], [place], [place] [address]       Radiology Studies: Dg Ercp Biliary & Pancreatic Ducts  Result Date: 05/05/2019 CLINICAL DATA:  78 year old female with pancreatic mass and obstructed jaundice EXAM: ERCP TECHNIQUE: Multiple spot images obtained with the fluoroscopic device and submitted for interpretation post-procedure. FLUOROSCOPY TIME:  Fluoroscopy Time:  1 minutes 51 seconds Radiation Exposure Index (if provided by the fluoroscopic device): 41.6 Number of Acquired Spot Images: 0 COMPARISON:  None. FINDINGS: Two intraoperative saved images demonstrate a flexible endoscope in the descending duodenum with deep wire cannulation of the right intrahepatic ducts. Cholangiogram demonstrates marked dilation of the common bile duct with abrupt tapering. On the final image, a metallic biliary stent has been placed. IMPRESSION: ERCP demonstrates malignant mid and distal biliary obstruction. Endoscopic stent placement. These images were submitted for radiologic interpretation only. Please see the procedural report for the amount of contrast and the fluoroscopy time utilized. Electronically Signed   By: Jacqulynn Cadet M.D.   On: 05/05/2019 13:09      LOS: 1 day   Time spent: More than 50% of that time was spent in counseling and/or coordination of care.  Antonieta Pert, MD [facility]  05/05/2019, 1:29 PM

## 2019-05-05 NOTE — Op Note (Signed)
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 Estimated Blood Loss:     Estimated blood loss was minimal. Procedure:                Pre-Anesthesia Assessment:                           - Prior to the procedure, a History and Physical                            was performed, and patient medications and                            allergies were reviewed. The patient's tolerance of                            previous anesthesia was also reviewed. The risks                            and benefits of the procedure and the sedation                            options and risks were discussed with the patient.                            All questions were answered, and informed consent                            was obtained. Prior Anticoagulants: The patient has                            taken no previous anticoagulant or antiplatelet                            agents. ASA Grade Assessment: II - A patient with  mild systemic disease. After reviewing the risks                            and benefits, the patient was deemed in                            satisfactory condition to undergo the procedure.                           After obtaining informed consent, the scope was                            passed under direct vision. Throughout the                            procedure, the patient's  blood pressure, pulse, and                            oxygen saturations were monitored continuously. The                            [id number]-[id number] ([id number]) Olympus duodenoscope was                            introduced through the mouth, and used to inject                            contrast into and used to cannulate the bile duct.                            The ERCP was somewhat difficult due to challenging                            cannulation because of abnormal anatomy. Successful                            completion of the procedure was aided by performing                            the maneuvers documented (below) in this report.                            The patient tolerated the procedure well. Scope In: Scope Out: Findings:      The major papilla was congested. Unfortunately on a few initial attempts       at cannulation the wire would only go towards the pancreas despite       repositioning so we elected to place one 5 Fr by 5 cm plastic stent with       a 3/4 external pigtail and a single internal flap was placed 4 cm into       the ventral pancreatic duct. The stent was in good position. We then       were able to fairly readily cannulate next to the stent and an obvious  distal stricture was confirmed and we proceeded with a small biliary       sphincterotomy was made with a Hydratome sphincterotome using ERBE       electrocautery. There was self limited oozing from the sphincterotomy       which did not require treatment. We could get the half bowed       sphincterotome easily in and out of the duct and the lower third of the       main bile duct contained a single localized stenosis approximately 3 cm       in length. Cells for cytology were obtained by brushing in the lower       third of the main bile duct. One 10 Fr by 6 cm uncovered metal stent       with no external flaps and no internal flaps was placed 5.5 cm into the       common bile duct. Bile flowed through  the stent. The stent was in good       position. Impression:               - The major papilla appeared congested.                           - A single localized biliary stricture was found in                            the lower third of the main bile duct. The                            stricture was probably malignant . This stricture                            was treated with biliary sphincterotomy brushing                            and stenting.                           - One plastic stent was placed into the ventral                            pancreatic duct.                           - A sphincterotomy was performed.                           - Cells for cytology obtained in the lower third of                            the main duct.                           - One uncovered metal stent was placed into the                            common bile duct. Moderate Sedation:  moderate sedation-none Recommendation:           - Clear liquid diet for 6 hours.                           - Continue present medications.                           - Await cytology results. May want to repeat CA 19                            when bili back to normal                           - Return to GI clinic PRN. If cytology negative                            might want to proceed with liver biopsy of mass to                            confirm metastatic disease and might need chest CT                            as well to evaluate lung lesions                           - Telephone GI clinic for pathology results in 4                            days.                           - Telephone GI clinic if symptomatic PRN. Will need                            abdominal x-ray in 1 to 2 weeks to confirm                            pancreatic duct stent passing on its own otherwise                            might need endoscopic removal at that time Procedure Code(s):        --- Professional ---                            [phone], Endoscopic retrograde                            cholangiopancreatography (ERCP); with placement of                            endoscopic stent into biliary or pancreatic duct,                            including pre- and post-dilation  and guide wire                            passage, when performed, including sphincterotomy,                            when performed, each stent                           43274, 44, Endoscopic retrograde                            cholangiopancreatography (ERCP); with placement of                            endoscopic stent into biliary or pancreatic duct,                            including pre- and post-dilation and guide wire                            passage, when performed, including sphincterotomy,                            when performed, each stent Diagnosis Code(s):        --- Professional ---                           K83.8, Other specified diseases of biliary tract                           K83.1, Obstruction of bile duct                           D49.0, Neoplasm of unspecified behavior of                            digestive system CPT copyright 2019 American Medical Association. All rights reserved. The codes documented in this report are preliminary and upon coder review may  be revised to meet current compliance requirements. Clarene Essex, MD 05/05/2019 10:40:53 AM This report has been signed electronically. Number of Addenda: 0

## 2019-05-05 NOTE — Transfer of Care (Signed)
Immediate Anesthesia Transfer of Care Note  Patient: Laura Mcintyre  Procedure(s) Performed: ENDOSCOPIC RETROGRADE CHOLANGIOPANCREATOGRAPHY (ERCP) WITH PROPOFOL (N/A ) SPHINCTEROTOMY BILIARY STENT PLACEMENT PANCREATIC STENT PLACEMENT BILIARY BRUSHING  Patient Location: Endoscopy Unit  Anesthesia Type:General  Level of Consciousness: awake, alert , oriented and patient cooperative  Airway & Oxygen Therapy: Patient Spontanous Breathing  Post-op Assessment: Report given to RN and Post -op Vital signs reviewed and stable  Post vital signs: Reviewed and stable  Last Vitals:  Vitals Value Taken Time  BP 172/65 05/05/19 1041  Temp 36.5 C 05/05/19 1041  Pulse 59 05/05/19 1042  Resp 16 05/05/19 1042  SpO2 96 % 05/05/19 1042  Vitals shown include unvalidated device data.  Last Pain:  Vitals:   05/05/19 1041  TempSrc: Oral  PainSc: 0-No pain      Patients Stated Pain Goal: 3 (56/15/37 9432)  Complications: No apparent anesthesia complications

## 2019-05-05 NOTE — Anesthesia Preprocedure Evaluation (Signed)
Anesthesia Evaluation  Patient identified by MRN, date of birth, ID band Patient awake    Reviewed: Allergy & Precautions, NPO status , Patient's Chart, lab work & pertinent test results, reviewed documented beta blocker date and time   Airway Mallampati: II  TM Distance: >3 FB Neck ROM: Full    Dental  (+) Dental Advisory Given, Upper Dentures, Partial Lower   Pulmonary neg pulmonary ROS,    Pulmonary exam normal breath sounds clear to auscultation       Cardiovascular hypertension, Pt. on medications and Pt. on home beta blockers Normal cardiovascular exam Rhythm:Regular Rate:Normal     Neuro/Psych CVA, No Residual Symptoms    GI/Hepatic negative GI ROS, Pancreatic mass, obstructive jaundice.   Endo/Other  negative endocrine ROS  Renal/GU negative Renal ROS     Musculoskeletal negative musculoskeletal ROS (+)   Abdominal   Peds  Hematology negative hematology ROS (+)   Anesthesia Other Findings Day of surgery medications reviewed with the patient.  Reproductive/Obstetrics                             Anesthesia Physical Anesthesia Plan  ASA: III  Anesthesia Plan: General   Post-op Pain Management:    Induction: Intravenous  PONV Risk Score and Plan: 3 and Dexamethasone and Ondansetron  Airway Management Planned: Oral ETT  Additional Equipment:   Intra-op Plan:   Post-operative Plan: Extubation in OR  Informed Consent: I have reviewed the patients History and Physical, chart, labs and discussed the procedure including the risks, benefits and alternatives for the proposed anesthesia with the patient or authorized representative who has indicated his/her understanding and acceptance.     Dental advisory given  Plan Discussed with: CRNA  Anesthesia Plan Comments:         Anesthesia Quick Evaluation

## 2019-05-05 NOTE — Progress Notes (Signed)
  Pt arrived to the unit from ERCP procedure.  B/P 182/71 HR 57.  Per report Pt was given Lopressor 5mg  IV before procedure due to b/p being on 190's.  Daily meds given upon arrival including lisinopril, lasix. MD notified  Per MD do not hold metoprolol.

## 2019-05-05 NOTE — Plan of Care (Signed)
  Problem: Safety: Goal: Ability to remain free from injury will improve Outcome: Progressing   Problem: Skin Integrity: Goal: Risk for impaired skin integrity will decrease Outcome: Progressing   

## 2019-05-05 NOTE — Plan of Care (Signed)
  Problem: Education: Goal: Knowledge of General Education information will improve Description: Including pain rating scale, medication(s)/side effects and non-pharmacologic comfort measures Outcome: Progressing   Problem: Nutrition: Goal: Adequate nutrition will be maintained Outcome: Progressing   Problem: Coping: Goal: Level of anxiety will decrease Outcome: Progressing   

## 2019-05-05 NOTE — Anesthesia Postprocedure Evaluation (Signed)
Anesthesia Post Note  Patient: Laura Mcintyre  Procedure(s) Performed: ENDOSCOPIC RETROGRADE CHOLANGIOPANCREATOGRAPHY (ERCP) WITH PROPOFOL (N/A ) SPHINCTEROTOMY BILIARY STENT PLACEMENT PANCREATIC STENT PLACEMENT BILIARY BRUSHING     Patient location during evaluation: PACU Anesthesia Type: General Level of consciousness: awake and alert Pain management: pain level controlled Vital Signs Assessment: post-procedure vital signs reviewed and stable Respiratory status: spontaneous breathing, nonlabored ventilation, respiratory function stable and patient connected to nasal cannula oxygen Cardiovascular status: blood pressure returned to baseline and stable Postop Assessment: no apparent nausea or vomiting Anesthetic complications: no    Last Vitals:  Vitals:   05/05/19 1110 05/05/19 1126  BP: (!) 191/69 (!) 182/71  Pulse: (!) 55 (!) 57  Resp: 17 18  Temp:  36.8 C  SpO2: 94% 96%    Last Pain:  Vitals:   05/05/19 1200  TempSrc:   PainSc: 0-No pain                 Catalina Gravel

## 2019-05-05 NOTE — Anesthesia Procedure Notes (Signed)
Procedure Name: Intubation Date/Time: 05/05/2019 9:43 AM Performed by: Shirlyn Goltz, CRNA Pre-anesthesia Checklist: Patient identified, Emergency Drugs available, Suction available and Patient being monitored Patient Re-evaluated:Patient Re-evaluated prior to induction Oxygen Delivery Method: Circle system utilized Preoxygenation: Pre-oxygenation with 100% oxygen Induction Type: IV induction Ventilation: Mask ventilation without difficulty Laryngoscope Size: Mac and 3 Grade View: Grade I Tube type: Oral Nasal Tubes: Right Tube size: 7.0 mm Number of attempts: 1 Airway Equipment and Method: Stylet Placement Confirmation: ETT inserted through vocal cords under direct vision,  positive ETCO2 and breath sounds checked- equal and bilateral Secured at: 20 cm Tube secured with: Tape Dental Injury: Teeth and Oropharynx as per pre-operative assessment

## 2019-05-05 NOTE — Progress Notes (Signed)
Laura Mcintyre 9:28 AM  Subjective: Patient seen and examined in her hospital computer chart reviewed and case discussed with Dr. Loletha Carrow and she is only noticed she has been jaundice for about a week and is only had minimal nausea and on Sunday had some abdominal pain but that resolved on its own and she still has her gallbladder and has no other complaints  Objective: Vital signs stable afebrile no acute distress obviously jaundiced exam please see preassessment evaluation labs reviewed x-ray reported as questionable liver mets questionable lung nodule and 3 cm pancreatic mass  Assessment: Obstructive jaundice  Plan: The risks benefits methods and success rate of ERCP with probable stenting was discussed with the patient and will proceed today with anesthesia assistance with further work-up and plans pending those findings  [facility]  office [phone] After 5PM or if no answer call [phone]

## 2019-05-06 ENCOUNTER — Encounter (HOSPITAL_COMMUNITY): Payer: Self-pay | Admitting: Gastroenterology

## 2019-05-06 LAB — CBC WITH DIFFERENTIAL/PLATELET
Abs Immature Granulocytes: 0.03 10*3/uL (ref 0.00–0.07)
Basophils Absolute: 0 10*3/uL (ref 0.0–0.1)
Basophils Relative: 0 %
Eosinophils Absolute: 0 10*3/uL (ref 0.0–0.5)
Eosinophils Relative: 0 %
HCT: 32.2 % — ABNORMAL LOW (ref 36.0–46.0)
Hemoglobin: 10.9 g/dL — ABNORMAL LOW (ref 12.0–15.0)
Immature Granulocytes: 0 %
Lymphocytes Relative: 19 %
Lymphs Abs: 1.3 10*3/uL (ref 0.7–4.0)
MCH: 30.8 pg (ref 26.0–34.0)
MCHC: 33.9 g/dL (ref 30.0–36.0)
MCV: 91 fL (ref 80.0–100.0)
Monocytes Absolute: 0.7 10*3/uL (ref 0.1–1.0)
Monocytes Relative: 10 %
Neutro Abs: 4.9 10*3/uL (ref 1.7–7.7)
Neutrophils Relative %: 71 %
Platelets: 231 10*3/uL (ref 150–400)
RBC: 3.54 MIL/uL — ABNORMAL LOW (ref 3.87–5.11)
RDW: 12.9 % (ref 11.5–15.5)
WBC: 7 10*3/uL (ref 4.0–10.5)
nRBC: 0 % (ref 0.0–0.2)

## 2019-05-06 LAB — COMPREHENSIVE METABOLIC PANEL
ALT: 451 U/L — ABNORMAL HIGH (ref 0–44)
AST: 217 U/L — ABNORMAL HIGH (ref 15–41)
Albumin: 2.8 g/dL — ABNORMAL LOW (ref 3.5–5.0)
Alkaline Phosphatase: 188 U/L — ABNORMAL HIGH (ref 38–126)
Anion gap: 9 (ref 5–15)
BUN: 11 mg/dL (ref 8–23)
CO2: 26 mmol/L (ref 22–32)
Calcium: 8.7 mg/dL — ABNORMAL LOW (ref 8.9–10.3)
Chloride: 102 mmol/L (ref 98–111)
Creatinine, Ser: 0.94 mg/dL (ref 0.44–1.00)
GFR calc Af Amer: >60 mL/min (ref >60)
GFR calc non Af Amer: 58 mL/min — ABNORMAL LOW (ref >60)
Glucose, Bld: 213 mg/dL — ABNORMAL HIGH (ref 70–99)
Potassium: 3.8 mmol/L (ref 3.5–5.1)
Sodium: 137 mmol/L (ref 135–145)
Total Bilirubin: 4.1 mg/dL — ABNORMAL HIGH (ref 0.3–1.2)
Total Protein: 5.8 g/dL — ABNORMAL LOW (ref 6.5–8.1)

## 2019-05-06 LAB — CANCER ANTIGEN 19-9: CA 19-9: 360 U/mL — ABNORMAL HIGH (ref 0–35)

## 2019-05-06 NOTE — Plan of Care (Signed)
Discharged home.

## 2019-05-06 NOTE — Discharge Summary (Signed)
Physician Discharge Summary  Laura Mcintyre TXM:[id number] DOB: [birth year] DOA: 05/03/2019  PCP: Emmaline Kluver, MD  Admit date: 05/03/2019 Discharge date: 05/06/2019  Admitted From: Home Disposition: Home  Recommendations for Outpatient Follow-up:  1. Follow up with PCP Dr. Loletha Carrow from GI  Next week 2. pcp for lft check.  Home Health:none  Equipment/Devices: none  Discharge Condition: Stable CODE STATUS: FULL Diet recommendation: Heart Healthy  Brief/Interim Summary: Per HPI: 78 y.o. female with medical history significant of hypertension, hyperlipidemia being sent to the hospital by her physician for evaluation of abnormal labs and CT scan findings.  Patient states her daughter has noticed that she has been looking yellow.  States she has lost about 6 to 8 pounds in the past 1.5 weeks.  She has lost her appetite.  3 days ago she experienced abdominal pain and fullness.Her urine is dark in color and her stool appears yellow.  She has not been feeling well.   In ER, VSS, labs-No leukocytosis.Lipase 366.  AST 455, ALT 614, alk phos 247, and T bili 8.0.  No prior labs for comparison.  UA not suggestive of infection.  COVID-19 rapid test neg. she was admitted for further management after discussion with GI. Patient was admitted. 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 Patient had CT chest done with pulmonary nodules intermediate for metastasis She is being discharged home with follow-up with Dr. Loletha Carrow as outpatient.  Subjective: Had ercp 7/3.doing well post op and today. Resting well no nausea vomiting abdominal pain.  Very eager to go home this morning.  Assessment & Plan:   Obstructive jaundice with pancreatic mass and biliary and pancreatic duct dilatation in imaging: Given overall clinical presentation imaging findings and lab suspecting pancreatic carcinoma.  GI is consulted and s/p ERCP:Localized biliary stricture was found lower  third of the main bile duct, sphincterotomy and brushing done with stenting continue clear liquid diet for next 6 hours, follow-up cytology results and repeat CA-19-9 once bili back to normal  AND IF "cytology negative might want to proceed with liver biopsy of mass to confirm metastatic disease and might need chest CT as well to evaluate lung lesions".Obtained CT chest no obvious metastasis seen.Discussed with GI patient will need to follow-up outpatient based on biopsy result further consultation with oncology, deferred to primary/GI.  Discussed with Dr. Loletha Carrow from GI this am, he will follow-up  biopsy results and arrange oncology referral and further work-up as outpatient.No need for antibiotic as no evidence of infection.  Elevated LFTs due to #1 total bili is down from 7.6  To 4.1 and LFTs are slowly downtrending.  Large complicated  Colon polyp:Dr. Melina Copa at [facility] had planned a referral and resection at [facility], on hold for now  HLD: holding statin due to elevated LFTs.Resume as outpatient once lft stable.  Hypertension blood pressure poorly controlled at times, this morning stable continue home lisinopril, metoprolol, lasix.Monitor and adjust medication.    Remote history of PUD and GI bleeding: Avoid NSAIDs.  Inadequate oral intake due to #1.Dietitian on board, augment nutrition.  DVT prophylaxis: SCD. Hold Heparin/lovenox Code Status: full code Family Communication: discussed w patient. Disposition Plan: Plan for home.Discussed with Dr. Loletha Carrow from GI plan of care. Plan of care discussed with patient's daughter over the phone who has verbalized understanding.pt does not have good hearing.  Consultants:  GI  Procedures:  ERCP -The major papilla appeared congested. -A single localized biliary stricture was found in the lower  third of the main bile duct. 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 This stricture was treated with biliary sphincterotomy brushing and stenting. -One plastic  stent was placed into the ventral pancreatic duct. - A sphincterotomy was performed.  Impression: -The major papilla appeared congested - A single localized biliary stricture was found in the lower third of the main bile duct. 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 This stricture was treated with biliary sphincterotomy brushing and stenting. - One plastic stent was placed into the ventral pancreatic duct. - A sphincterotomy was performed. - Cells for cytology obtained in the lower third of the main duct. - One uncovered metal stent was placed into the common bile duct.  Recommendations: - Clear liquid diet for 6 hours. - Continue present medications. - Await cytology results. May want to repeat CA 19 when bili back to normal - Return to GI clinic PRN. If cytology negative might want to proceed with liver biopsy of mass to confirm metastatic disease and might need chest CT as well to evaluate lung lesions - Telephone GI clinic for pathology results in 4 days. - Telephone GI clinic if symptomatic PRN. Will need abdominal x-ray in 1 to 2 weeks to confirm pancreatic duct stent passing on its own otherwise might need endoscopic removal at that time  CT chest  IMPRESSION: 1. Few scattered solid pulmonary nodules in both lungs, largest 6 mm, indeterminate for pulmonary metastases. Recommend attention on follow-up chest CT in 3 months. These nodules are below PET resolution at this time. 2. No thoracic adenopathy or other potential findings of metastatic disease in the chest. 3. Pneumobilia in intrahepatic bile ducts. Partially visualized CBD stent. Pancreas incompletely visualized on this noncontrast chest CT study. 4. One vessel coronary atherosclerosis.  Aortic Atherosclerosis (ICD10-I70.0  Discharge Instructions  Discharge Instructions    Diet - low sodium heart healthy   Complete by: As directed    Discharge instructions   Complete by: As directed    Please call call MD  or return to ER for similar or worsening recurring problem that brought you to hospital or if any fever,nausea/vomiting,abdominal pain, uncontrolled pain, chest pain,  shortness of breath or any other alarming symptoms. Follow-up please follow-up your doctor as instructed.  Please hold your statin until resumed by your primary care doctor after checking liver function test.  Follow-up with GI doctor for biopsy results and for further work-up.   in a week time and call the office for appointment. Please avoid alcohol, smoking, or any other illicit substance and maintain healthy habits including taking your regular medications as prescribed.  You were cared for by a hospitalist during your hospital stay. If you have any questions about your discharge medications or the care you received while you were in the hospital after you are discharged, you can call the unit and asked to speak with the hospitalist on call if the hospitalist that took care of you is not available. Once you are discharged, your primary care physician will handle any further medical issues. Please note that NO REFILLS for any discharge medications will be authorized once you are discharged, as it is imperative that you return to your primary care physician (or establish a relationship with a primary care physician if you do not have one) for your aftercare needs so that they can reassess your need for medications and monitor your lab values   Increase activity slowly   Complete by: As directed      Allergies as of  05/06/2019      Reactions   Aloe Other (See Comments)   Black stools   Hydralazine Hcl    Headache    Atorvastatin Other (See Comments)   "bones ache"   Celecoxib Other (See Comments)   .   Colesevelam Other (See Comments)   .   Ezetimibe Other (See Comments)   "bones ache"    Fosinopril Other (See Comments)   .   Ibuprofen Other (See Comments)   Caused " bleeding ulcers"   Morphine Other (See Comments)    ."makes me sleepy"   Oxycodone Other (See Comments)   Personal preference   Simvastatin Other (See Comments)   "bones ache"   Statins Other (See Comments)   "bones ache" Other reaction(s): Other (See Comments) . . .   Triamcinolone Acetonide Other (See Comments)   .      Medication List    STOP taking these medications   simvastatin 10 MG tablet Commonly known as: ZOCOR     TAKE these medications   aspirin 81 MG tablet Take 1 tablet (81 mg total) by mouth daily. What changed: when to take this   estrogens (conjugated) 1.25 MG tablet Commonly known as: PREMARIN Take 1.25 mg by mouth daily.   FISH OIL PO Take 1 tablet by mouth 2 (two) times daily.   furosemide 40 MG tablet Commonly known as: LASIX Take 1 tablet (40 mg total) by mouth daily.   hydroxychloroquine 200 MG tablet Commonly known as: PLAQUENIL Take 100 mg by mouth 2 (two) times daily.   lisinopril 40 MG tablet Commonly known as: ZESTRIL Take 1 tablet (40 mg total) by mouth 2 (two) times daily. What changed:   when to take this  additional instructions   metoprolol tartrate 50 MG tablet Commonly known as: LOPRESSOR Take 0.5 tablets (25 mg total) by mouth daily.   Oscal 500/200 D-3 500-200 MG-UNIT tablet Generic drug: calcium-vitamin D Take 1 tablet by mouth daily.   potassium chloride SA 20 MEQ tablet Commonly known as: K-DUR Take 1 tablet (20 mEq total) by mouth daily.      Follow-up Information    Street, Sharon [place], MD Follow up in 2 week(s).   Specialty: Family Medicine Contact information: [place] [address] [place], [place], [place] Follow up in 4 day(s).   Specialty: Gastroenterology Why: for biopsy result and further work up Contact information: [place] [address] [place] [address] [phone]          Allergies  Allergen Reactions  . Aloe Other (See Comments)    Black stools  . Hydralazine Hcl     Headache   .  Atorvastatin Other (See Comments)    "bones ache"  . Celecoxib Other (See Comments)    .  Marland Kitchen Colesevelam Other (See Comments)    .  Marland Kitchen Ezetimibe Other (See Comments)    "bones ache"   . Fosinopril Other (See Comments)    .  Marland Kitchen Ibuprofen Other (See Comments)    Caused " bleeding ulcers"  . Morphine Other (See Comments)    ."makes me sleepy"  . Oxycodone Other (See Comments)    Personal preference  . Simvastatin Other (See Comments)    "bones ache"  . Statins Other (See Comments)    "bones ache" Other reaction(s): Other (See Comments) . . .  . Triamcinolone Acetonide Other (See Comments)    .  Consultations:  **  Procedures/Studies: Ct Chest Wo Contrast  Result Date: 05/05/2019 CLINICAL DATA:  Inpatient. Dyspnea. Weight loss. Obstructive jaundice with reported pancreatic mass. EXAM: CT CHEST WITHOUT CONTRAST TECHNIQUE: Multidetector CT imaging of the chest was performed following the standard protocol without IV contrast. COMPARISON:  None. FINDINGS: Cardiovascular: Normal heart size. No significant pericardial effusion/thickening. Left anterior descending coronary atherosclerosis. Mildly atherosclerotic nonaneurysmal thoracic aorta. Normal caliber pulmonary arteries. Mediastinum/Nodes: No discrete thyroid nodules. Unremarkable esophagus. No pathologically enlarged axillary, mediastinal or hilar lymph nodes, noting limited sensitivity for the detection of hilar adenopathy on this noncontrast study. Lungs/Pleura: No pneumothorax. No pleural effusion. No acute consolidative airspace disease or lung masses. A few (at least 5) solid pulmonary nodules scattered in both lungs, largest 6 mm in the left lower lobe (series 4/image 92). Upper abdomen: Pneumobilia scattered throughout the intrahepatic bile ducts, most prominent in the left liver lobe. Partially visualized CBD stent, the distal tip of which is not seen on this scan. Pancreas is incompletely visualized on this scan. Mild fullness  of the visualized central right renal collecting system. Musculoskeletal: No aggressive appearing focal osseous lesions. Mild thoracic spondylosis. IMPRESSION: 1. Few scattered solid pulmonary nodules in both lungs, largest 6 mm, indeterminate for pulmonary metastases. Recommend attention on follow-up chest CT in 3 months. These nodules are below PET resolution at this time. 2. No thoracic adenopathy or other potential findings of metastatic disease in the chest. 3. Pneumobilia in intrahepatic bile ducts. Partially visualized CBD stent. Pancreas incompletely visualized on this noncontrast chest CT study. 4. One vessel coronary atherosclerosis. Aortic Atherosclerosis (ICD10-I70.0). Electronically Signed   By: Ilona Sorrel M.D.   On: 05/05/2019 18:41   Dg Ercp Biliary & Pancreatic Ducts  Result Date: 05/05/2019 CLINICAL DATA:  78 year old female with pancreatic mass and obstructed jaundice EXAM: ERCP TECHNIQUE: Multiple spot images obtained with the fluoroscopic device and submitted for interpretation post-procedure. FLUOROSCOPY TIME:  Fluoroscopy Time:  1 minutes 51 seconds Radiation Exposure Index (if provided by the fluoroscopic device): 41.6 Number of Acquired Spot Images: 0 COMPARISON:  None. FINDINGS: Two intraoperative saved images demonstrate a flexible endoscope in the descending duodenum with deep wire cannulation of the right intrahepatic ducts. Cholangiogram demonstrates marked dilation of the common bile duct with abrupt tapering. On the final image, a metallic biliary stent has been placed. IMPRESSION: ERCP demonstrates malignant mid and distal biliary obstruction. Endoscopic stent placement. These images were submitted for radiologic interpretation only. Please see the procedural report for the amount of contrast and the fluoroscopy time utilized. Electronically Signed   By: Jacqulynn Cadet M.D.   On: 05/05/2019 13:09   Discharge Exam: Vitals:   05/05/19 2039 05/06/19 0411  BP: 131/61 (!)  142/60  Pulse: 64 61  Resp: 16 16  Temp: 98.5 F (36.9 C) 98.3 F (36.8 C)  SpO2: 95% 98%   Vitals:   05/05/19 1126 05/05/19 1524 05/05/19 2039 05/06/19 0411  BP: (!) 182/71 (!) 154/60 131/61 (!) 142/60  Pulse: (!) 57 61 64 61  Resp: '18 17 16 16  '$ Temp: 98.3 F (36.8 C) 98.1 F (36.7 C) 98.5 F (36.9 C) 98.3 F (36.8 C)  TempSrc:  Oral Oral Oral  SpO2: 96% 94% 95% 98%  Weight:      Height:        General: Pt is alert, awake, not in acute distress Cardiovascular: RRR, S1/S2 +, no rubs, no gallops Respiratory: CTA bilaterally, no wheezing, no rhonchi Abdominal: Soft, NT, ND, bowel sounds +  Extremities: no edema, no cyanosis   The results of significant diagnostics from this hospitalization (including imaging, microbiology, ancillary and laboratory) are listed below for reference.     Microbiology: Recent Results (from the past 240 hour(s))  SARS Coronavirus 2 (CEPHEID - Performed in [facility] hospital lab), Hosp Order     Status: None   Collection Time: 05/03/19  8:24 PM   Specimen: Nasopharyngeal Swab  Result Value Ref Range Status   SARS Coronavirus 2 NEGATIVE NEGATIVE Final    Comment: (NOTE) If result is NEGATIVE SARS-CoV-2 target nucleic acids are NOT DETECTED. The SARS-CoV-2 RNA is generally detectable in upper and lower  respiratory specimens during the acute phase of infection. The lowest  concentration of SARS-CoV-2 viral copies this assay can detect is 250  copies / mL. A negative result does not preclude SARS-CoV-2 infection  and should not be used as the sole basis for treatment or other  patient management decisions.  A negative result may occur with  improper specimen collection / handling, submission of specimen other  than nasopharyngeal swab, presence of viral mutation(s) within the  areas targeted by this assay, and inadequate number of viral copies  (<250 copies / mL). A negative result must be combined with clinical  observations, patient  history, and epidemiological information. If result is POSITIVE SARS-CoV-2 target nucleic acids are DETECTED. The SARS-CoV-2 RNA is generally detectable in upper and lower  respiratory specimens dur ing the acute phase of infection.  Positive  results are indicative of active infection with SARS-CoV-2.  Clinical  correlation with patient history and other diagnostic information is  necessary to determine patient infection status.  Positive results do  not rule out bacterial infection or co-infection with other viruses. If result is PRESUMPTIVE POSTIVE SARS-CoV-2 nucleic acids MAY BE PRESENT.   A presumptive positive result was obtained on the submitted specimen  and confirmed on repeat testing.  While 2019 novel coronavirus  (SARS-CoV-2) nucleic acids may be present in the submitted sample  additional confirmatory testing may be necessary for epidemiological  and / or clinical management purposes  to differentiate between  SARS-CoV-2 and other Sarbecovirus currently known to infect humans.  If clinically indicated additional testing with an alternate test  methodology [phone]) is advised. The SARS-CoV-2 RNA is generally  detectable in upper and lower respiratory sp ecimens during the acute  phase of infection. The expected result is Negative. Fact Sheet for Patients:  StrictlyIdeas.no Fact Sheet for Healthcare Providers: BankingDealers.co.za This test is not yet approved or cleared by the [place] FDA and has been authorized for detection and/or diagnosis of SARS-CoV-2 by FDA under an Emergency Use Authorization (EUA).  This EUA will remain in effect (meaning this test can be used) for the duration of the COVID-19 declaration under Section 564(b)(1) of the Act, 21 U.S.C. section 360bbb-3(b)(1), unless the authorization is terminated or revoked sooner. Performed at [facility], [place] [address] [place], [place] [address]    MRSA PCR Screening     Status: None   Collection Time: 05/04/19  8:46 AM   Specimen: Nasal Mucosa; Nasopharyngeal  Result Value Ref Range Status   MRSA by PCR NEGATIVE NEGATIVE Final    Comment:        The GeneXpert MRSA Assay (FDA approved for NASAL specimens only), is one component of a comprehensive MRSA colonization surveillance program. It is not intended to diagnose MRSA infection nor to guide or monitor treatment for MRSA infections. Performed at [facility]  [facility], [place] [address] [place], [place] [address]      Labs: BNP (last 3 results) No results for input(s): BNP in the last 8760 hours. Basic Metabolic Panel: Recent Labs  Lab 05/03/19 1640 05/06/19 0342  NA 136 137  K 3.6 3.8  CL 99 102  CO2 25 26  GLUCOSE 123* 213*  BUN 15 11  CREATININE 0.98 0.94  CALCIUM 9.5 8.7*   Liver Function Tests: Recent Labs  Lab 05/03/19 1640 05/04/19 0506 05/06/19 0342  AST 455* 431* 217*  ALT 614* 586* 451*  ALKPHOS 247* 222* 188*  BILITOT 8.0* 7.6* 4.1*  PROT 7.1 6.5 5.8*  ALBUMIN 3.8 3.2* 2.8*   Recent Labs  Lab 05/03/19 1640 05/04/19 0506  LIPASE 366* 367*   No results for input(s): AMMONIA in the last 168 hours. CBC: Recent Labs  Lab 05/03/19 1640 05/06/19 0342  WBC 6.0 7.0  NEUTROABS  --  4.9  HGB 12.5 10.9*  HCT 37.5 32.2*  MCV 92.4 91.0  PLT 266 231   Cardiac Enzymes: No results for input(s): CKTOTAL, CKMB, CKMBINDEX, TROPONINI in the last 168 hours. BNP: Invalid input(s): POCBNP CBG: No results for input(s): GLUCAP in the last 168 hours. D-Dimer No results for input(s): DDIMER in the last 72 hours. Hgb A1c No results for input(s): HGBA1C in the last 72 hours. Lipid Profile No results for input(s): CHOL, HDL, LDLCALC, TRIG, CHOLHDL, LDLDIRECT in the last 72 hours. Thyroid function studies No results for input(s): TSH, T4TOTAL, T3FREE, THYROIDAB in the last 72 hours.  Invalid input(s): FREET3 Anemia work up No results for input(s):  VITAMINB12, FOLATE, FERRITIN, TIBC, IRON, RETICCTPCT in the last 72 hours. Urinalysis    Component Value Date/Time   COLORURINE YELLOW 05/03/2019 1703   APPEARANCEUR CLEAR 05/03/2019 1703   LABSPEC 1.020 05/03/2019 1703   PHURINE 5.0 05/03/2019 1703   GLUCOSEU NEGATIVE 05/03/2019 1703   HGBUR NEGATIVE 05/03/2019 1703   BILIRUBINUR NEGATIVE 05/03/2019 1703   KETONESUR NEGATIVE 05/03/2019 1703   PROTEINUR NEGATIVE 05/03/2019 1703   NITRITE NEGATIVE 05/03/2019 1703   LEUKOCYTESUR TRACE (A) 05/03/2019 1703   Sepsis Labs Invalid input(s): PROCALCITONIN,  WBC,  LACTICIDVEN Microbiology Recent Results (from the past 240 hour(s))  SARS Coronavirus 2 (CEPHEID - Performed in [place] hospital lab), Hosp Order     Status: None   Collection Time: 05/03/19  8:24 PM   Specimen: Nasopharyngeal Swab  Result Value Ref Range Status   SARS Coronavirus 2 NEGATIVE NEGATIVE Final    Comment: (NOTE) If result is NEGATIVE SARS-CoV-2 target nucleic acids are NOT DETECTED. The SARS-CoV-2 RNA is generally detectable in upper and lower  respiratory specimens during the acute phase of infection. The lowest  concentration of SARS-CoV-2 viral copies this assay can detect is 250  copies / mL. A negative result does not preclude SARS-CoV-2 infection  and should not be used as the sole basis for treatment or other  patient management decisions.  A negative result may occur with  improper specimen collection / handling, submission of specimen other  than nasopharyngeal swab, presence of viral mutation(s) within the  areas targeted by this assay, and inadequate number of viral copies  (<250 copies / mL). A negative result must be combined with clinical  observations, patient history, and epidemiological information. If result is POSITIVE SARS-CoV-2 target nucleic acids are DETECTED. The SARS-CoV-2 RNA is generally detectable in upper and lower  respiratory specimens dur ing the acute phase of infection.  Positive  results are indicative of active infection with SARS-CoV-2.  Clinical  correlation with patient history and other diagnostic information is  necessary to determine patient infection status.  Positive results do  not rule out bacterial infection or co-infection with other viruses. If result is PRESUMPTIVE POSTIVE SARS-CoV-2 nucleic acids MAY BE PRESENT.   A presumptive positive result was obtained on the submitted specimen  and confirmed on repeat testing.  While 2019 novel coronavirus  (SARS-CoV-2) nucleic acids may be present in the submitted sample  additional confirmatory testing may be necessary for epidemiological  and / or clinical management purposes  to differentiate between  SARS-CoV-2 and other Sarbecovirus currently known to infect humans.  If clinically indicated additional testing with an alternate test  methodology [phone]) is advised. The SARS-CoV-2 RNA is generally  detectable in upper and lower respiratory sp ecimens during the acute  phase of infection. The expected result is Negative. Fact Sheet for Patients:  StrictlyIdeas.no Fact Sheet for Healthcare Providers: BankingDealers.co.za This test is not yet approved or cleared by the [place] FDA and has been authorized for detection and/or diagnosis of SARS-CoV-2 by FDA under an Emergency Use Authorization (EUA).  This EUA will remain in effect (meaning this test can be used) for the duration of the COVID-19 declaration under Section 564(b)(1) of the Act, 21 U.S.C. section 360bbb-3(b)(1), unless the authorization is terminated or revoked sooner. Performed at [facility], [place] [address] [place], [place] [address]   MRSA PCR Screening     Status: None   Collection Time: 05/04/19  8:46 AM   Specimen: Nasal Mucosa; Nasopharyngeal  Result Value Ref Range Status   MRSA by PCR NEGATIVE NEGATIVE Final    Comment:        The GeneXpert MRSA Assay  (FDA approved for NASAL specimens only), is one component of a comprehensive MRSA colonization surveillance program. It is not intended to diagnose MRSA infection nor to guide or monitor treatment for MRSA infections. Performed at [facility], [place] [address] [place], [place] [address]      Time coordinating discharge: 35 minutes  SIGNED:   Antonieta Pert, MD  [facility] 05/06/2019, 10:06 AM  If 7PM-7AM, please contact night-coverage [email or web address]   Antimicrobials: Anti-infectives (From admission, onward)   Start     Dose/Rate Route Frequency Ordered Stop   05/05/19 0900  Ampicillin-Sulbactam (UNASYN) 3 g in sodium chloride 0.9 % 100 mL IVPB  Status:  Discontinued     3 g 200 mL/hr over 30 Minutes Intravenous  Once 05/04/19 1824 05/05/19 1113   05/04/19 1100  hydroxychloroquine (PLAQUENIL) tablet 100 mg     100 mg Oral 2 times daily 05/04/19 0954     05/03/19 1915  piperacillin-tazobactam (ZOSYN) IVPB 3.375 g     3.375 g 100 mL/hr over 30 Minutes Intravenous  Once 05/03/19 1909 05/03/19 2334       Objective: Vitals:   05/05/19 1126 05/05/19 1524 05/05/19 2039 05/06/19 0411  BP: (!) 182/71 (!) 154/60 131/61 (!) 142/60  Pulse: (!) 57 61 64 61  Resp: '18 17 16 16  '$ Temp: 98.3 F (36.8 C) 98.1 F (36.7 C) 98.5 F (36.9 C) 98.3 F (36.8 C)  TempSrc:  Oral Oral Oral  SpO2: 96% 94% 95% 98%  Weight:      Height:        Intake/Output Summary (Last 24 hours) at 05/06/2019 1006 Last data filed at 05/05/2019 2005 Gross per 24 hour  Intake  1362.58 ml  Output 500 ml  Net 862.58 ml   Filed Weights   05/03/19 2055 05/05/19 0828  Weight: 72.1 kg 72.1 kg   Weight change:   Body mass index is 26.45 kg/m.  Intake/Output from previous day: 07/03 0701 - 07/04 0700 In: 1362.6 [P.O.:60; I.V.:1302.6] Out: 500 [Urine:500] Intake/Output this shift: No intake/output data recorded.  Examination:  General exam: Sleepy, not in acute distress.   HEENT:PERRL,Oral  mucosa moist, Ear/Nose normal on gross exam Respiratory system: Bilateral equal air entry, normal vesicular breath sounds, no wheezes or crackles  Cardiovascular system: S1 & S2 heard,No JVD, murmurs. Gastrointestinal system: Abdomen is  soft, mildly tender on mid abdomen, non distended, BS +  Nervous System: Sleepy.No focal neurological deficits/moving extremities, sensation intact. Extremities: No edema, no clubbing, distal peripheral pulses palpable. Skin: No rashes, lesions, no icterus MSK: Normal muscle bulk,tone ,power  Medications:  Scheduled Meds: . calcium-vitamin D  1 tablet Oral Daily  . estrogens (conjugated)  1.25 mg Oral Daily  . famotidine  20 mg Oral BID  . feeding supplement (ENSURE ENLIVE)  237 mL Oral BID BM  . furosemide  40 mg Oral Daily  . hydroxychloroquine  100 mg Oral BID  . indomethacin  100 mg Rectal Once  . indomethacin  100 mg Rectal Once  . lisinopril  40 mg Oral BID  . metoprolol tartrate  25 mg Oral Daily  . multivitamin with minerals  1 tablet Oral Daily  . omega-3 acid ethyl esters  1 g Oral Daily  . potassium chloride SA  20 mEq Oral Daily  . sodium chloride flush  3 mL Intravenous Once   Continuous Infusions: . dextrose 5 % and 0.9% NaCl 75 mL/hr at 05/05/19 2003  . lactated ringers Stopped (05/05/19 1112)    Data Reviewed: I have personally reviewed following labs and imaging studies  CBC: Recent Labs  Lab 05/03/19 1640 05/06/19 0342  WBC 6.0 7.0  NEUTROABS  --  4.9  HGB 12.5 10.9*  HCT 37.5 32.2*  MCV 92.4 91.0  PLT 266 937   Basic Metabolic Panel: Recent Labs  Lab 05/03/19 1640 05/06/19 0342  NA 136 137  K 3.6 3.8  CL 99 102  CO2 25 26  GLUCOSE 123* 213*  BUN 15 11  CREATININE 0.98 0.94  CALCIUM 9.5 8.7*   GFR: Estimated Creatinine Clearance: 49.1 mL/min (by C-G formula based on SCr of 0.94 mg/dL). Liver Function Tests: Recent Labs  Lab 05/03/19 1640 05/04/19 0506 05/06/19 0342  AST 455* 431* 217*  ALT 614*  586* 451*  ALKPHOS 247* 222* 188*  BILITOT 8.0* 7.6* 4.1*  PROT 7.1 6.5 5.8*  ALBUMIN 3.8 3.2* 2.8*   Recent Labs  Lab 05/03/19 1640 05/04/19 0506  LIPASE 366* 367*   No results for input(s): AMMONIA in the last 168 hours. Coagulation Profile: Recent Labs  Lab 05/04/19 0748  INR 1.1   Cardiac Enzymes: No results for input(s): CKTOTAL, CKMB, CKMBINDEX, TROPONINI in the last 168 hours. BNP (last 3 results) No results for input(s): PROBNP in the last 8760 hours. HbA1C: No results for input(s): HGBA1C in the last 72 hours. CBG: No results for input(s): GLUCAP in the last 168 hours. Lipid Profile: No results for input(s): CHOL, HDL, LDLCALC, TRIG, CHOLHDL, LDLDIRECT in the last 72 hours. Thyroid Function Tests: No results for input(s): TSH, T4TOTAL, FREET4, T3FREE, THYROIDAB in the last 72 hours. Anemia Panel: No results for input(s): VITAMINB12, FOLATE, FERRITIN, TIBC, IRON, RETICCTPCT in the  last 72 hours. Sepsis Labs: No results for input(s): PROCALCITON, LATICACIDVEN in the last 168 hours.  Recent Results (from the past 240 hour(s))  SARS Coronavirus 2 (CEPHEID - Performed in [facility] hospital lab), Hosp Order     Status: None   Collection Time: 05/03/19  8:24 PM   Specimen: Nasopharyngeal Swab  Result Value Ref Range Status   SARS Coronavirus 2 NEGATIVE NEGATIVE Final    Comment: (NOTE) If result is NEGATIVE SARS-CoV-2 target nucleic acids are NOT DETECTED. The SARS-CoV-2 RNA is generally detectable in upper and lower  respiratory specimens during the acute phase of infection. The lowest  concentration of SARS-CoV-2 viral copies this assay can detect is 250  copies / mL. A negative result does not preclude SARS-CoV-2 infection  and should not be used as the sole basis for treatment or other  patient management decisions.  A negative result may occur with  improper specimen collection / handling, submission of specimen other  than nasopharyngeal swab, presence of  viral mutation(s) within the  areas targeted by this assay, and inadequate number of viral copies  (<250 copies / mL). A negative result must be combined with clinical  observations, patient history, and epidemiological information. If result is POSITIVE SARS-CoV-2 target nucleic acids are DETECTED. The SARS-CoV-2 RNA is generally detectable in upper and lower  respiratory specimens dur ing the acute phase of infection.  Positive  results are indicative of active infection with SARS-CoV-2.  Clinical  correlation with patient history and other diagnostic information is  necessary to determine patient infection status.  Positive results do  not rule out bacterial infection or co-infection with other viruses. If result is PRESUMPTIVE POSTIVE SARS-CoV-2 nucleic acids MAY BE PRESENT.   A presumptive positive result was obtained on the submitted specimen  and confirmed on repeat testing.  While 2019 novel coronavirus  (SARS-CoV-2) nucleic acids may be present in the submitted sample  additional confirmatory testing may be necessary for epidemiological  and / or clinical management purposes  to differentiate between  SARS-CoV-2 and other Sarbecovirus currently known to infect humans.  If clinically indicated additional testing with an alternate test  methodology [phone]) is advised. The SARS-CoV-2 RNA is generally  detectable in upper and lower respiratory sp ecimens during the acute  phase of infection. The expected result is Negative. Fact Sheet for Patients:  StrictlyIdeas.no Fact Sheet for Healthcare Providers: BankingDealers.co.za This test is not yet approved or cleared by the [place] FDA and has been authorized for detection and/or diagnosis of SARS-CoV-2 by FDA under an Emergency Use Authorization (EUA).  This EUA will remain in effect (meaning this test can be used) for the duration of the COVID-19 declaration under Section  564(b)(1) of the Act, 21 U.S.C. section 360bbb-3(b)(1), unless the authorization is terminated or revoked sooner. Performed at [facility], [place] [address], [place], [place] [address]   MRSA PCR Screening     Status: None   Collection Time: 05/04/19  8:46 AM   Specimen: Nasal Mucosa; Nasopharyngeal  Result Value Ref Range Status   MRSA by PCR NEGATIVE NEGATIVE Final    Comment:        The GeneXpert MRSA Assay (FDA approved for NASAL specimens only), is one component of a comprehensive MRSA colonization surveillance program. It is not intended to diagnose MRSA infection nor to guide or monitor treatment for MRSA infections. Performed at [facility], [place] [address] [place], [place] [address]  Radiology Studies: Ct Chest Wo Contrast  Result Date: 05/05/2019 CLINICAL DATA:  Inpatient. Dyspnea. Weight loss. Obstructive jaundice with reported pancreatic mass. EXAM: CT CHEST WITHOUT CONTRAST TECHNIQUE: Multidetector CT imaging of the chest was performed following the standard protocol without IV contrast. COMPARISON:  None. FINDINGS: Cardiovascular: Normal heart size. No significant pericardial effusion/thickening. Left anterior descending coronary atherosclerosis. Mildly atherosclerotic nonaneurysmal thoracic aorta. Normal caliber pulmonary arteries. Mediastinum/Nodes: No discrete thyroid nodules. Unremarkable esophagus. No pathologically enlarged axillary, mediastinal or hilar lymph nodes, noting limited sensitivity for the detection of hilar adenopathy on this noncontrast study. Lungs/Pleura: No pneumothorax. No pleural effusion. No acute consolidative airspace disease or lung masses. A few (at least 5) solid pulmonary nodules scattered in both lungs, largest 6 mm in the left lower lobe (series 4/image 92). Upper abdomen: Pneumobilia scattered throughout the intrahepatic bile ducts, most prominent in the left liver lobe. Partially visualized CBD stent, the distal tip of  which is not seen on this scan. Pancreas is incompletely visualized on this scan. Mild fullness of the visualized central right renal collecting system. Musculoskeletal: No aggressive appearing focal osseous lesions. Mild thoracic spondylosis. IMPRESSION: 1. Few scattered solid pulmonary nodules in both lungs, largest 6 mm, indeterminate for pulmonary metastases. Recommend attention on follow-up chest CT in 3 months. These nodules are below PET resolution at this time. 2. No thoracic adenopathy or other potential findings of metastatic disease in the chest. 3. Pneumobilia in intrahepatic bile ducts. Partially visualized CBD stent. Pancreas incompletely visualized on this noncontrast chest CT study. 4. One vessel coronary atherosclerosis. Aortic Atherosclerosis (ICD10-I70.0). Electronically Signed   By: Ilona Sorrel M.D.   On: 05/05/2019 18:41   Dg Ercp Biliary & Pancreatic Ducts  Result Date: 05/05/2019 CLINICAL DATA:  78 year old female with pancreatic mass and obstructed jaundice EXAM: ERCP TECHNIQUE: Multiple spot images obtained with the fluoroscopic device and submitted for interpretation post-procedure. FLUOROSCOPY TIME:  Fluoroscopy Time:  1 minutes 51 seconds Radiation Exposure Index (if provided by the fluoroscopic device): 41.6 Number of Acquired Spot Images: 0 COMPARISON:  None. FINDINGS: Two intraoperative saved images demonstrate a flexible endoscope in the descending duodenum with deep wire cannulation of the right intrahepatic ducts. Cholangiogram demonstrates marked dilation of the common bile duct with abrupt tapering. On the final image, a metallic biliary stent has been placed. IMPRESSION: ERCP demonstrates malignant mid and distal biliary obstruction. Endoscopic stent placement. These images were submitted for radiologic interpretation only. Please see the procedural report for the amount of contrast and the fluoroscopy time utilized. Electronically Signed   By: Jacqulynn Cadet M.D.   On:  05/05/2019 13:09      LOS: 2 days   Time spent: More than 50% of that time was spent in counseling and/or coordination of care.  Antonieta Pert, MD [facility]  05/06/2019, 10:06 AM

## 2019-05-10 ENCOUNTER — Telehealth: Payer: Self-pay | Admitting: Gastroenterology

## 2019-05-10 ENCOUNTER — Other Ambulatory Visit: Payer: Self-pay

## 2019-05-10 DIAGNOSIS — K8689 Other specified diseases of pancreas: Secondary | ICD-10-CM

## 2019-05-10 NOTE — Telephone Encounter (Signed)
Spoke with pts daughter and she is aware of everything mentioned below.

## 2019-05-10 NOTE — Telephone Encounter (Signed)
Referral made to [facility], Arna Snipe RN aware of referral. Records faxed to [phone] to Dr. Venetia Maxon, requested hepatic function panel be checked this week. Order in for KUB, she may [facility] between 7:30am-5:30pm, she does not need an appt.

## 2019-05-10 NOTE — Telephone Encounter (Signed)
Pts daughter calling for results from ERCP, discussed with her that the results are not back yet. She wants to know if her mother needs to follow-up with Dr. Loletha Carrow. She usually sees Dr. Melina Copa in [place]. Please advise.

## 2019-05-10 NOTE — Telephone Encounter (Signed)
I spoke with her daughter Laura Mcintyre just now regarding the final ERCP brushing reports showing pancreatic cancer.  I reviewed the entire scenario with her and plan as follows:  Laura Mcintyre is apparently seeing her primary care provider, Dr. Christa See at [facility] in [place] tomorrow afternoon.  Please send a copy of the discharge summary, my hospital consult note, Dr. Perley Jain ERCP report, the chest CT report done in hospital, last set of LFTs in the hospital and CA-19-9 level.  I am referring this patient to the [facility] health cancer center.  Please send an ASAP referral to the [facility] long cancer coordinator for pancreatic cancer.  The patient's abdominal and pelvic CT scan was done through her primary care provider in [place].  There was a written report of it apparently available to the admitting hospitalist, but a copy of that would need to be obtained from primary care and then sent along with the referral (or afterwards as a second set of records once the initial referral was made) Laura Mcintyre confirmed that would like to give her mother the diagnosis and full information, and they will also talk it over further with primary care tomorrow.    With the records that are sent to Dr. Venetia Maxon today, please also request that they get a hepatic function panel this week.  Lastly, I would like the patient to have a KUB at a [place] radiology facility (?  Is there a [place] x-ray facility or outpatient radiology available at [facility], since it is reasonably close to patient's home).  The x-ray is for recent ERCP, confirm passage of plastic pancreatic duct stent.  Patient also has metal biliary stent in place.

## 2019-05-11 ENCOUNTER — Telehealth: Payer: Self-pay | Admitting: Hematology

## 2019-05-11 ENCOUNTER — Telehealth: Payer: Self-pay

## 2019-05-11 NOTE — Telephone Encounter (Signed)
Called patient's daughter to introduce role of nurse navigator and to provide information about upcoming appointment. Visitor restrictions explained. Patient and daughter do not have smart phones. Patient is hard of hearing. Provided direct contact information for questions or support.

## 2019-05-11 NOTE — Telephone Encounter (Signed)
Per patient spoke with dtr Juliann Pulse re new patient appointment with Dr. Burr Medico 7/14 @ 1 pm. Juliann Pulse given date/ttime/location. Demographic information confirmed.

## 2019-05-13 ENCOUNTER — Telehealth: Payer: Self-pay

## 2019-05-13 NOTE — Telephone Encounter (Signed)
Returned patient's call to daughter, Juliann Pulse who wanted to provide additional information to Dr. Burr Medico concerning, her mother's health history, prior to initial consult.  Patient had routine colonoscopy fall of 2019 with large cecal  polyp removed and clip placed.  Pathology negative. Repeat colonoscopy June 2020 which showed the polyp had grown back around polyp.  [facility] was in the process of scheduling a submucosal resection when patient was diagnosed with pancreas cancer.  Agreed to provide info to Dr. Burr Medico prior to apt.

## 2019-05-15 NOTE — Progress Notes (Signed)
[place]   Telephone:(336) [phone] Fax:([phone]) [place] Note   Patient Care Team: Street, Sharon Mt, MD as PCP - General (Family Medicine) Bettina Gavia Hilton Cork, MD as Consulting Physician (Cardiology) Arna Snipe, RN as Oncology Nurse Navigator  Date of Service:  05/16/2019   CHIEF COMPLAINTS/PURPOSE OF CONSULTATION:  Newly Diagnosed Pancreatic Cancer   REFERRING PHYSICIAN:  Hospitalist   Oncology History  Pancreatic cancer ([employer])  05/05/2019 Initial Biopsy   Diagnosis 05/05/19  BILE DUCT BRUSHING (SPECIMEN 1 OF 1, COLLECTED 05/05/2019): MALIGNANT CELLS CONSISTENT WITH ADENOCARCINOMA.   05/05/2019 Procedure   ERCP by Dr. Watt Climes 05/05/19 IMPRESSION -The major papilla appeared congested. 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 This stricture was treated with biliary sphincterotomy brushing and stenting. - One plastic stent was placed into the ventral pancreatic duct. - A sphincterotomy was performed. - Cells for cytology obtained in the lower third of the main duct. - One uncovered metal stent was placed into the common bile duct.   05/05/2019 Imaging   CT Chest WO contrast 05/05/19  IMPRESSION: 1. Few scattered solid pulmonary nodules in both lungs, largest 6 mm, indeterminate for pulmonary metastases. Recommend attention on follow-up chest CT in 3 months. These nodules are below PET resolution at this time. 2. No thoracic adenopathy or other potential findings of metastatic disease in the chest. 3. Pneumobilia in intrahepatic bile ducts. Partially visualized CBD stent. Pancreas incompletely visualized on this noncontrast chest CT study. 4. One vessel coronary atherosclerosis.   Aortic Atherosclerosis (ICD10-I70.0).   05/16/2019 Initial Diagnosis   Pancreatic cancer (HCC)      HISTORY OF PRESENTING ILLNESS:  Laura Mcintyre 78 y.o. female is a here  because of newly diagnosed pancreatic cancer. The patient was referred by Hospitalist upon her recent discharge. The patient presents to the clinic today alone. She called her daughter to be included in the visit. She is hard of hearing.   She was planning to have a colonoscopy at [facility] after polyps were found. Her family noticed she was jaundiced. Same day she went to see Dr. Arizona Constable in [place] who gave her a CT scan. She was then advised to go to [facility] for further evaluation, given concerns for cancer.  1-2 weeks before this she lost 8 pounds in 10 days due to nausea. She also felt weaker and more fatigued lately. Her appetite was low. She notes only having 1 day of abdominal pain. She takes 2 Citrucel stool softeners in the morning to prevent constipation. She notes since her stent was placed her jaundice resolved.  She has been on estrogen to help her emotionally. She feels as long as she doe snot have physical burden from symptoms, she is emotionally stable.   Socially she lives alone. She is able to take care of herself. She gets a lot of help from her daughter and son. She notes she is active usually and does yard work, but not anymore. She will take a break as needed. She can still do all housework.   They have a PMHx of HTN, on Lasix and lisinopril and lopressor. She had a stroke at age 75, she had right sided effects temporarily. She has Rheumatoid arthritis, on plaquenil. Very present in hands and right hip and occasionally in knee. She had knee surgery. She had benign left breast mass removed. She also had h/lo of bleeding ulcers in 1980s.  She had 2 sisters with breast cancer, both in their 39s.     REVIEW OF SYSTEMS:    Constitutional: Denies fevers, chills or abnormal night sweats (+) weight loss, low appetite  Eyes: Denies blurriness of vision, double vision or watery eyes Ears, nose, mouth, throat, and face: Denies mucositis or sore throat (+) hard of hearing  Respiratory: Denies cough,  dyspnea or wheezes Cardiovascular: Denies palpitation, chest discomfort or lower extremity swelling Gastrointestinal:  Denies heartburn or change in bowel habits (+) nausea  Skin: Denies abnormal skin rashes MSK: (+) RA of right hip and hands  (+) occasional knee pain  Lymphatics: Denies new lymphadenopathy (+) easy bruising Neurological:Denies numbness, tingling or new weaknesses Behavioral/Psych: Mood is stable, no new changes (+) emotional  All other systems were reviewed with the patient and are negative.   MEDICAL HISTORY:  Past Medical History:  Diagnosis Date   Abnormal EKG 06/05/2015   Overview:  stress echo is negative for ischemia Oct 2011   Abnormal electrocardiography 06/05/2015   Overview:  Overview:  stress echo is negative for ischemia Oct 2011   Essential hypertension 06/05/2015   Familial hyperlipidemia 06/05/2015   Hallux valgus of right foot 01/16/2016   Hammer toes of both feet 01/16/2016   Hyperlipidemia 06/05/2015   Stroke ([place])    Tailor's bunion 01/16/2016    SURGICAL HISTORY: Past Surgical History:  Procedure Laterality Date   ABDOMINAL HYSTERECTOMY     APPENDECTOMY     BILIARY BRUSHING  05/05/2019   Procedure: BILIARY BRUSHING;  Surgeon: Clarene Essex, MD;  Location: [facility] ENDOSCOPY;  Service: Endoscopy;;   BILIARY STENT PLACEMENT  05/05/2019   Procedure: BILIARY STENT PLACEMENT;  Surgeon: Clarene Essex, MD;  Location: [place];  Service: Endoscopy;;   ENDOSCOPIC RETROGRADE CHOLANGIOPANCREATOGRAPHY (ERCP) WITH PROPOFOL N/A 05/05/2019   Procedure: ENDOSCOPIC RETROGRADE CHOLANGIOPANCREATOGRAPHY (ERCP) WITH PROPOFOL;  Surgeon: Clarene Essex, MD;  Location: [place];  Service: Endoscopy;  Laterality: N/A;   KNEE SURGERY     PANCREATIC STENT PLACEMENT  05/05/2019   Procedure: PANCREATIC STENT PLACEMENT;  Surgeon: Clarene Essex, MD;  Location: [place];  Service: Endoscopy;;   SPHINCTEROTOMY  05/05/2019   Procedure: Joan Mayans;  Surgeon: Clarene Essex, MD;   Location: [facility] ENDOSCOPY;  Service: Endoscopy;;    SOCIAL HISTORY: Social History   Socioeconomic History   Marital status: Legally Separated    Spouse name: Not on file   Number of children: Not on file   Years of education: Not on file   Highest education level: Not on file  Occupational History   Not on file  Social Needs   Financial resource strain: Not on file   Food insecurity    Worry: Not on file    Inability: Not on file   Transportation needs    Medical: Not on file    Non-medical: Not on file  Tobacco Use   Smoking status: Never Smoker   Smokeless tobacco: Never Used  Substance and Sexual Activity   Alcohol use: No   Drug use: No   Sexual activity: Not on file  Lifestyle   Physical activity    Days per week: Not on file    Minutes per session: Not on file   Stress: Not on file  Relationships   Social connections    Talks on phone: Not on file    Gets together: Not on file    Attends religious service: Not on file    Active member of club or organization: Not  on file    Attends meetings of clubs or organizations: Not on file    Relationship status: Not on file   Intimate partner violence    Fear of current or ex partner: Not on file    Emotionally abused: Not on file    Physically abused: Not on file    Forced sexual activity: Not on file  Other Topics Concern   Not on file  Social History Narrative   Not on file    FAMILY HISTORY: Family History  Problem Relation Age of Onset   Heart failure Mother    Stroke Mother    Cancer Sister 50       breast cancer   Heart attack Brother    Cancer Sister 19       breast cancer    ALLERGIES:  is allergic to aloe; hydralazine hcl; atorvastatin; celecoxib; colesevelam; ezetimibe; fosinopril; ibuprofen; morphine; oxycodone; simvastatin; statins; and triamcinolone acetonide.  MEDICATIONS:  Current Outpatient Medications  Medication Sig Dispense Refill   aspirin 81 MG tablet Take  1 tablet (81 mg total) by mouth daily. (Patient taking differently: Take 81 mg by mouth at bedtime. ) 30 tablet 3   calcium-vitamin D (OSCAL 500/200 D-3) 500-200 MG-UNIT tablet Take 1 tablet by mouth daily.      estrogens, conjugated, (PREMARIN) 1.25 MG tablet Take 1.25 mg by mouth daily.     furosemide (LASIX) 40 MG tablet Take 1 tablet (40 mg total) by mouth daily. 90 tablet 1   hydroxychloroquine (PLAQUENIL) 200 MG tablet Take 100 mg by mouth 2 (two) times daily.     lisinopril (ZESTRIL) 40 MG tablet Take 1 tablet (40 mg total) by mouth 2 (two) times daily. (Patient taking differently: Take 40 mg by mouth See admin instructions. Take 28m in the morning and 270mat bedtime) 60 tablet 0   metoprolol tartrate (LOPRESSOR) 50 MG tablet Take 0.5 tablets (25 mg total) by mouth daily. 45 tablet 3   Omega-3 Fatty Acids (FISH OIL PO) Take 1 tablet by mouth 2 (two) times daily.      potassium chloride SA (K-DUR,KLOR-CON) 20 MEQ tablet Take 1 tablet (20 mEq total) by mouth daily. 90 tablet 3   No current facility-administered medications for this visit.     PHYSICAL EXAMINATION: ECOG PERFORMANCE STATUS: 1 - Symptomatic but completely ambulatory  Vitals:   05/16/19 1250  BP: (!) 148/69  Pulse: 63  Resp: 18  Temp: 98 F (36.7 C)  SpO2: 98%   Filed Weights   05/16/19 1250  Weight: 153 lb 14.4 oz (69.8 kg)    GENERAL:alert, no distress and comfortable SKIN: skin color, texture, turgor are normal, no rashes or significant lesions (+) bruising of skin on ankles and forearms  EYES: normal, Conjunctiva are pink and non-injected, sclera clear  NECK: supple, thyroid normal size, non-tender, without nodularity LYMPH:  no palpable lymphadenopathy in the cervical, axillary  LUNGS: clear to auscultation and percussion with normal breathing effort HEART: regular rate & rhythm and no murmurs and no lower extremity edema ABDOMEN:abdomen soft, non-tender and normal bowel sounds (+) Epigastric  sensitivity Musculoskeletal:no cyanosis of digits and no clubbing  NEURO: alert & oriented x 3 with fluent speech, no focal motor/sensory deficits  LABORATORY DATA:  I have reviewed the data as listed CBC Latest Ref Rng & Units 05/06/2019 05/03/2019  WBC 4.0 - 10.5 K/uL 7.0 6.0  Hemoglobin 12.0 - 15.0 g/dL 10.9(L) 12.5  Hematocrit 36.0 - 46.0 % 32.2(L) 37.5  Platelets  150 - 400 K/uL 231 266    CMP Latest Ref Rng & Units 05/06/2019 05/04/2019 05/03/2019  Glucose 70 - 99 mg/dL 213(H) - 123(H)  BUN 8 - 23 mg/dL 11 - 15  Creatinine 0.44 - 1.00 mg/dL 0.94 - 0.98  Sodium 135 - 145 mmol/L 137 - 136  Potassium 3.5 - 5.1 mmol/L 3.8 - 3.6  Chloride 98 - 111 mmol/L 102 - 99  CO2 22 - 32 mmol/L 26 - 25  Calcium 8.9 - 10.3 mg/dL 8.7(L) - 9.5  Total Protein 6.5 - 8.1 g/dL 5.8(L) 6.5 7.1  Total Bilirubin 0.3 - 1.2 mg/dL 4.1(H) 7.6(H) 8.0(H)  Alkaline Phos 38 - 126 U/L 188(H) 222(H) 247(H)  AST 15 - 41 U/L 217(H) 431(H) 455(H)  ALT 0 - 44 U/L 451(H) 586(H) 614(H)     RADIOGRAPHIC STUDIES: I have personally reviewed the radiological images as listed and agreed with the findings in the report. Ct Chest Wo Contrast  Result Date: 05/05/2019 CLINICAL DATA:  Inpatient. Dyspnea. Weight loss. Obstructive jaundice with reported pancreatic mass. EXAM: CT CHEST WITHOUT CONTRAST TECHNIQUE: Multidetector CT imaging of the chest was performed following the standard protocol without IV contrast. COMPARISON:  None. FINDINGS: Cardiovascular: Normal heart size. No significant pericardial effusion/thickening. Left anterior descending coronary atherosclerosis. Mildly atherosclerotic nonaneurysmal thoracic aorta. Normal caliber pulmonary arteries. Mediastinum/Nodes: No discrete thyroid nodules. Unremarkable esophagus. No pathologically enlarged axillary, mediastinal or hilar lymph nodes, noting limited sensitivity for the detection of hilar adenopathy on this noncontrast study. Lungs/Pleura: No pneumothorax. No pleural effusion.  No acute consolidative airspace disease or lung masses. A few (at least 5) solid pulmonary nodules scattered in both lungs, largest 6 mm in the left lower lobe (series 4/image 92). Upper abdomen: Pneumobilia scattered throughout the intrahepatic bile ducts, most prominent in the left liver lobe. Partially visualized CBD stent, the distal tip of which is not seen on this scan. Pancreas is incompletely visualized on this scan. Mild fullness of the visualized central right renal collecting system. Musculoskeletal: No aggressive appearing focal osseous lesions. Mild thoracic spondylosis. IMPRESSION: 1. Few scattered solid pulmonary nodules in both lungs, largest 6 mm, indeterminate for pulmonary metastases. Recommend attention on follow-up chest CT in 3 months. These nodules are below PET resolution at this time. 2. No thoracic adenopathy or other potential findings of metastatic disease in the chest. 3. Pneumobilia in intrahepatic bile ducts. Partially visualized CBD stent. Pancreas incompletely visualized on this noncontrast chest CT study. 4. One vessel coronary atherosclerosis. Aortic Atherosclerosis (ICD10-I70.0). Electronically Signed   By: Ilona Sorrel M.D.   On: 05/05/2019 18:41   Dg Ercp Biliary & Pancreatic Ducts  Result Date: 05/05/2019 CLINICAL DATA:  78 year old female with pancreatic mass and obstructed jaundice EXAM: ERCP TECHNIQUE: Multiple spot images obtained with the fluoroscopic device and submitted for interpretation post-procedure. FLUOROSCOPY TIME:  Fluoroscopy Time:  1 minutes 51 seconds Radiation Exposure Index (if provided by the fluoroscopic device): 41.6 Number of Acquired Spot Images: 0 COMPARISON:  None. FINDINGS: Two intraoperative saved images demonstrate a flexible endoscope in the descending duodenum with deep wire cannulation of the right intrahepatic ducts. Cholangiogram demonstrates marked dilation of the common bile duct with abrupt tapering. On the final image, a metallic  biliary stent has been placed. IMPRESSION: ERCP demonstrates malignant mid and distal biliary obstruction. Endoscopic stent placement. These images were submitted for radiologic interpretation only. Please see the procedural report for the amount of contrast and the fluoroscopy time utilized. Electronically Signed   By: Myrle Sheng  Laurence Ferrari M.D.   On: 05/05/2019 13:09    ASSESSMENT & PLAN:  Larsen Zettel is a 78 y.o. caucasian female with a history of RA and HTN and H/o stroke   1. Adenocarcinoma of head of Pancrease -I reviewed and discussed her image findings and pathology reports in great detail with her and her daughter.  She presented with jaundice, fatigue and weight loss. -We discussed her CT [facility] from [facility] which showed a large mass at head of pancrease as well as two liver lesions which are highly suspicious for liver metastasis. CT chest from 05/05/19 shows small nodules in the lung which are indeterminate for pulmonary metastases, will monitor. -Her cytology from bile duct brushing shows adenocarcinoma.  Based on her CT scan, this is consistent with primary pancreatic cancer. -There is high suspicion her liver lesions are metastatic. For definitive diagnosis I recommend liver biopsy. She is interested.  -We discussed the nature history of pancreatic cancer, high risk of recurrence after surgery for resectable disease.  We discussed the incurable nature of metastatic disease, and overall poor prognosis, which short life expectancy, around 3-6 months.  -If biopsy confirms this is metastatic disease, then her cancer would be stage IV and no longer curable and no longer eligible for surgery. Standard treatment would include systemic chemotherapy to control her disease and prolong her life. Although she is overall healthy, given her age and previous history of stroke, I do not think she is a candidate for intensive chemotherapy, and would recommend single agent Gemcitabine 2 weeks on/1 week off.   Potential benefit and side effects reviewed with her. -I recommend Genetic testing given her family history of breast cancer. I also recommend MMR testing and FO genomic testing on her biopsy sample. She is interested. Based on results she may have targetable mutations or immunotherapy options to treat her cancer, although the possibility is low   -Physical exam today normal except epigastric mild tenderness  -F/u in 2-3 weeks to finalize her treatment plan   2. Obstructive Jaundice -She is s/p stent placement on 05/05/19. Her jaundice has resolved.   3. Nausea, low appetite, weight loss  -secondary to #1  -I discussed her cancer may lead ot constipation and abdominal pain. She has tramadol that she will use half tablet as needed for pain.  -She will continue her prophylactic Citrucel stool softener BID.  -Will set up dietician consult if needed. She will use Ensure to help improve her weight.   4. Colon Polyps  -She has colon polyps encasing clips that have not been removed yet  -I will f/u with Dr. Ouida Sills about this as they will likely postpone removal procedure.   5. HTN, H/o stroke at age 7 -She often has body swelling.  -On HTN, on Lasix and lisinopril and lopressor and aspirin  6. Rheumatoid arthritis -On Plaquenil  -Continue to follow up with her RA -She has tramadol for pain but causes brain fog so does not use often. She is also sensitive/allergic to Codeine.   7. Social support  -She lives alone and takes care of herself well  -She has good support and help from her daughter and son and friends  -I offered her the chance to speak with out social workers for counseling. She declined for now.   8. Goal of care discussion  -We discussed if her cancer is found to be incurable, she will have an overall poor prognosis, especially if she does not have good response to chemotherapy or progress  on chemo -I recommend DNR/DNI, she will think about it. I also recommend she set up a  living will.    PLAN:  -IR Liver biopsy in 1-2 weeks with FO -Genetic consult and testing today  -F/u in 2-3 weeks   Orders Placed This Encounter  Procedures   US BIOPSY (LIVER)    Standing Status:   Future    Standing Expiration Date:   07/16/2020    Order Specific Question:   Lab orders requested (DO NOT place separate lab orders, these will be automatically ordered during procedure specimen collection):    Answer:   Surgical Pathology    Order Specific Question:   Reason for Exam (SYMPTOM  OR DIAGNOSIS REQUIRED)    Answer:   confirm metastatic disease    Order Specific Question:   Preferred location?    Answer:   [facility]   CBC with Differential (Cancer Center Only)    Standing Status:   Standing    Number of Occurrences:   100    Standing Expiration Date:   05/15/2024   CMP ([place] only)    Standing Status:   Standing    Number of Occurrences:   100    Standing Expiration Date:   05/15/2024   CA 19.9    Standing Status:   Standing    Number of Occurrences:   100    Standing Expiration Date:   05/15/2024   Ambulatory referral to Genetics    Referral Priority:   Urgent    Referral Type:   Consultation    Referral Reason:   Specialty Services Required    Number of Visits Requested:   1    All questions were answered. The patient knows to call the clinic with any problems, questions or concerns. I spent 55 minutes counseling the patient face to face. The total time spent in the appointment was 60 minutes and more than 50% was on counseling. I included her daughter on the phone during her visit.      Truitt Merle, MD 05/16/2019  I, Joslyn Devon, am acting as scribe for Truitt Merle, MD.   I have reviewed the above documentation for accuracy and completeness, and I agree with the above.

## 2019-05-16 ENCOUNTER — Other Ambulatory Visit: Payer: Self-pay

## 2019-05-16 ENCOUNTER — Telehealth: Payer: Self-pay | Admitting: Hematology

## 2019-05-16 ENCOUNTER — Encounter: Payer: Self-pay | Admitting: Genetic Counselor

## 2019-05-16 ENCOUNTER — Ambulatory Visit (HOSPITAL_COMMUNITY)
Admission: RE | Admit: 2019-05-16 | Discharge: 2019-05-16 | Disposition: A | Discharge status: Home / Self Care | Payer: Medicare Other | Source: Ambulatory Visit | Attending: Gastroenterology | Admitting: Gastroenterology

## 2019-05-16 ENCOUNTER — Inpatient Hospital Stay: Payer: Medicare Other | Attending: Hematology | Admitting: Hematology

## 2019-05-16 ENCOUNTER — Encounter: Payer: Self-pay | Admitting: Hematology

## 2019-05-16 ENCOUNTER — Inpatient Hospital Stay: Payer: Medicare Other

## 2019-05-16 ENCOUNTER — Inpatient Hospital Stay (HOSPITAL_BASED_OUTPATIENT_CLINIC_OR_DEPARTMENT_OTHER): Payer: Medicare Other | Admitting: Genetic Counselor

## 2019-05-16 ENCOUNTER — Other Ambulatory Visit: Payer: Self-pay | Admitting: Genetic Counselor

## 2019-05-16 DIAGNOSIS — K831 Obstruction of bile duct: Secondary | ICD-10-CM | POA: Insufficient documentation

## 2019-05-16 DIAGNOSIS — C250 Malignant neoplasm of head of pancreas: Secondary | ICD-10-CM

## 2019-05-16 DIAGNOSIS — K8689 Other specified diseases of pancreas: Secondary | ICD-10-CM | POA: Insufficient documentation

## 2019-05-16 DIAGNOSIS — C787 Secondary malignant neoplasm of liver and intrahepatic bile duct: Secondary | ICD-10-CM | POA: Diagnosis not present

## 2019-05-16 DIAGNOSIS — R110 Nausea: Secondary | ICD-10-CM | POA: Diagnosis not present

## 2019-05-16 DIAGNOSIS — K635 Polyp of colon: Secondary | ICD-10-CM | POA: Diagnosis not present

## 2019-05-16 DIAGNOSIS — M069 Rheumatoid arthritis, unspecified: Secondary | ICD-10-CM

## 2019-05-16 DIAGNOSIS — Z803 Family history of malignant neoplasm of breast: Secondary | ICD-10-CM | POA: Insufficient documentation

## 2019-05-16 DIAGNOSIS — I10 Essential (primary) hypertension: Secondary | ICD-10-CM | POA: Diagnosis not present

## 2019-05-16 DIAGNOSIS — C259 Malignant neoplasm of pancreas, unspecified: Secondary | ICD-10-CM | POA: Insufficient documentation

## 2019-05-16 LAB — CMP (CANCER CENTER ONLY)
ALT: 171 U/L — ABNORMAL HIGH (ref 0–44)
AST: 78 U/L — ABNORMAL HIGH (ref 15–41)
Albumin: 3.5 g/dL (ref 3.5–5.0)
Alkaline Phosphatase: 135 U/L — ABNORMAL HIGH (ref 38–126)
Anion gap: 9 (ref 5–15)
BUN: 14 mg/dL (ref 8–23)
CO2: 28 mmol/L (ref 22–32)
Calcium: 9.4 mg/dL (ref 8.9–10.3)
Chloride: 101 mmol/L (ref 98–111)
Creatinine: 0.85 mg/dL (ref 0.44–1.00)
GFR, Est AFR Am: >60 mL/min (ref >60)
GFR, Estimated: >60 mL/min (ref >60)
Glucose, Bld: 120 mg/dL — ABNORMAL HIGH (ref 70–99)
Potassium: 4.6 mmol/L (ref 3.5–5.1)
Sodium: 138 mmol/L (ref 135–145)
Total Bilirubin: 1.3 mg/dL — ABNORMAL HIGH (ref 0.3–1.2)
Total Protein: 6.8 g/dL (ref 6.5–8.1)

## 2019-05-16 LAB — CBC WITH DIFFERENTIAL (CANCER CENTER ONLY)
Abs Immature Granulocytes: 0.01 10*3/uL (ref 0.00–0.07)
Basophils Absolute: 0.1 10*3/uL (ref 0.0–0.1)
Basophils Relative: 1 %
Eosinophils Absolute: 0.4 10*3/uL (ref 0.0–0.5)
Eosinophils Relative: 6 %
HCT: 36 % (ref 36.0–46.0)
Hemoglobin: 12 g/dL (ref 12.0–15.0)
Immature Granulocytes: 0 %
Lymphocytes Relative: 32 %
Lymphs Abs: 2 10*3/uL (ref 0.7–4.0)
MCH: 30.5 pg (ref 26.0–34.0)
MCHC: 33.3 g/dL (ref 30.0–36.0)
MCV: 91.4 fL (ref 80.0–100.0)
Monocytes Absolute: 0.8 10*3/uL (ref 0.1–1.0)
Monocytes Relative: 12 %
Neutro Abs: 3.1 10*3/uL (ref 1.7–7.7)
Neutrophils Relative %: 49 %
Platelet Count: 428 10*3/uL — ABNORMAL HIGH (ref 150–400)
RBC: 3.94 MIL/uL (ref 3.87–5.11)
RDW: 11.9 % (ref 11.5–15.5)
WBC Count: 6.4 10*3/uL (ref 4.0–10.5)
nRBC: 0 % (ref 0.0–0.2)

## 2019-05-16 NOTE — Progress Notes (Signed)
Participated in initial consult with Dr. Burr Medico. Booklet from [facility] provided. Will follow-up in getting CT scan from [facility]. Spoke to [facility] radiology to request imaging on disc. As requested , faxed request on 7/13 with instruction to deliver to Dr. Ernestina Penna nurse. I included [facility]'s [employer]#.

## 2019-05-16 NOTE — Progress Notes (Signed)
REFERRING PROVIDER: Truitt Merle, MD [address] [place],  [place] [address]  PRIMARY PROVIDER:  Street, Sharon Mt, MD  PRIMARY REASON FOR VISIT:  1. 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 Family history of breast cancer      HISTORY OF PRESENT ILLNESS:   Laura Mcintyre, a 78 y.o. female, was seen for a [place] cancer genetics consultation at the request of Dr. Burr Medico due to a personal and family history of cancer.  Laura Mcintyre presents to clinic today to discuss the possibility of a hereditary predisposition to cancer, genetic testing, and to further clarify her future cancer risks, as well as potential cancer risks for family members.   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 The treatment plan is in the process of being planned.     CANCER HISTORY:  Oncology History  Pancreatic cancer ([place])  05/05/2019 Initial Biopsy   Diagnosis 05/05/19  BILE DUCT BRUSHING (SPECIMEN 1 OF 1, COLLECTED 05/05/2019): MALIGNANT CELLS CONSISTENT WITH ADENOCARCINOMA.   05/05/2019 Procedure   ERCP by Dr. Watt Climes 05/05/19 IMPRESSION -The major papilla appeared congested. 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 This stricture was treated with biliary sphincterotomy brushing and stenting. - One plastic stent was placed into the ventral pancreatic duct. - A sphincterotomy was performed. - Cells for cytology obtained in the lower third of the main duct. - One uncovered metal stent was placed into the common bile duct.   05/05/2019 Imaging   CT Chest WO contrast 05/05/19  IMPRESSION: 1. Few scattered solid pulmonary nodules in both lungs, largest 6 mm, indeterminate for pulmonary metastases. Recommend attention on follow-up chest CT in 3 months. These nodules are below PET resolution at this time. 2. No thoracic adenopathy or other potential  findings of metastatic disease in the chest. 3. Pneumobilia in intrahepatic bile ducts. Partially visualized CBD stent. Pancreas incompletely visualized on this noncontrast chest CT study. 4. One vessel coronary atherosclerosis.   Aortic Atherosclerosis (ICD10-I70.0).   05/16/2019 Initial Diagnosis   Pancreatic cancer [facility])       Past Medical History:  Diagnosis Date  . Abnormal EKG 06/05/2015   Overview:  stress echo is negative for ischemia Oct 2011  . Abnormal electrocardiography 06/05/2015   Overview:  Overview:  stress echo is negative for ischemia Oct 2011  . Essential hypertension 06/05/2015  . 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 Family history of breast cancer   . Hallux valgus of right foot 01/16/2016  . 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 Hyperlipidemia 06/05/2015  . Stroke ([place])   . 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 ABDOMINAL HYSTERECTOMY    . APPENDECTOMY    . BILIARY BRUSHING  05/05/2019   Procedure: BILIARY BRUSHING;  Surgeon: Clarene Essex, MD;  Location: [facility] ENDOSCOPY;  Service: Endoscopy;;  . BILIARY STENT PLACEMENT  05/05/2019   Procedure: BILIARY STENT PLACEMENT;  Surgeon: Clarene Essex, MD;  Location: [place];  Service: Endoscopy;;  . ENDOSCOPIC RETROGRADE CHOLANGIOPANCREATOGRAPHY (ERCP) WITH PROPOFOL N/A 05/05/2019   Procedure: ENDOSCOPIC RETROGRADE CHOLANGIOPANCREATOGRAPHY (ERCP) WITH PROPOFOL;  Surgeon: Clarene Essex, MD;  Location: [place];  Service: Endoscopy;  Laterality: N/A;  . KNEE SURGERY    . PANCREATIC STENT PLACEMENT  05/05/2019   Procedure: PANCREATIC STENT PLACEMENT;  Surgeon:  Clarene Essex, MD;  Location[facility]COPY;  Service: Endoscopy;;  . Joan Mayans  05/05/2019   Procedure: Joan Mayans;  Surgeon: Clarene Essex, MD;  Location: [facility] ENDOSCOPY;  Service: Endoscopy;;    Social History   Socioeconomic History  . Marital status: Legally Separated    Spouse name: Not on file  . Number of  children: Not on file  . Years of education: Not on file  . Highest education level: Not on file  Occupational History  . Not on file  Social Needs  . Financial resource strain: Not on file  . Food insecurity    Worry: Not on file    Inability: Not on file  . Transportation needs    Medical: Not on file    Non-medical: Not on file  Tobacco Use  . Smoking status: Never Smoker  . Smokeless tobacco: Never Used  Substance and Sexual Activity  . Alcohol use: No  . Drug use: No  . Sexual activity: Not on file  Lifestyle  . Physical activity    Days per week: Not on file    Minutes per session: Not on file  . Stress: Not on file  Relationships  . Social Herbalist on phone: Not on file    Gets together: Not on file    Attends religious service: Not on file    Active member of club or organization: Not on file    Attends meetings of clubs or organizations: Not on file    Relationship status: Not on file  Other Topics Concern  . Not on file  Social History Narrative  . Not on file     FAMILY HISTORY:  We obtained a detailed, 4-generation family history.  Significant diagnoses are listed below: Family History  Problem Relation Age of Onset  . Heart failure Mother   . Stroke Mother   . Macular degeneration Mother   . Cancer Sister 56       breast cancer  . Heart attack Brother   . Lung cancer Brother 62  . Cancer Sister 45       breast cancer  . Bone cancer Sister     The patient has a son and daughter who are cancer free.  She has 10 siblings.  One brother died of lung cancer and had a son who died of cancer, and two sisters had breast cancer.  One sister had bilateral breast cancer in her 33's, and the other had breast cancer in her 72's.  A third sister had bone cancer that was diagnosed one week before she died.Both parents are deceased.  The patient's mother had 12 siblings, none were reported to have cancer.  There is no other known history of  cancer.  The patient's father had 14 siblings.  None were reported to have cancer.  There is no other reported history of cancer.  Laura Mcintyre is unaware of previous family history of genetic testing for hereditary cancer risks. Patient's maternal ancestors are of Caucasian descent, and paternal ancestors are of Caucasian descent. There is no reported Ashkenazi Jewish ancestry. There is no known consanguinity.  GENETIC COUNSELING ASSESSMENT: Laura Mcintyre is a 78 y.o. female with a personal and family history of cancer which is somewhat suggestive of a hereditary cancer syndrome and predisposition to cancer. We, therefore, discussed and recommended the following at today's visit.   DISCUSSION: We discussed that 5 - 10% of pancreatic cancer is hereditary, with most cases associated with BRCA mutations.  There are other genes that can be associated with hereditary pancreatic cancer syndromes.  Therefore, we have offered testing for these other genes as well.  We discussed that testing is beneficial for several reasons including knowing how to follow individuals after completing their treatment, identifying whether potential treatment options such as PARP inhibitors would be beneficial, and understand if other family members could be at risk for cancer and allow them to undergo genetic testing.   We reviewed the characteristics, features and inheritance patterns of hereditary cancer syndromes. We also discussed genetic testing, including the appropriate family members to test, the process of testing, insurance coverage and turn-around-time for results. We discussed the implications of a negative, positive and/or variant of uncertain significant result. We recommended Laura Mcintyre pursue genetic testing for the common hereditary cancer gene panel. The Common Hereditary Gene Panel offered by [facility] includes sequencing and/or deletion duplication testing of the following 48 genes: APC, ATM, AXIN2, BARD1, BMPR1A,  BRCA1, BRCA2, BRIP1, CDH1, CDK4, CDKN2A (p14ARF), CDKN2A (p16INK4a), CHEK2, CTNNA1, DICER1, EPCAM (Deletion/duplication testing only), GREM1 (promoter region deletion/duplication testing only), KIT, MEN1, MLH1, MSH2, MSH3, MSH6, MUTYH, NBN, NF1, NHTL1, PALB2, PDGFRA, PMS2, POLD1, POLE, PTEN, RAD50, RAD51C, RAD51D, RNF43, SDHB, SDHC, SDHD, SMAD4, SMARCA4. STK11, TP53, TSC1, TSC2, and VHL.  The following genes were evaluated for sequence changes only: SDHA and HOXB13 c.251G>A variant only.   Based on Laura Mcintyre personal and family history of cancer, she meets medical criteria for genetic testing. Despite that she meets criteria, she may still have an out of pocket cost. We discussed that if her out of pocket cost for testing is over $100, the laboratory will call and confirm whether she wants to proceed with testing.  If the out of pocket cost of testing is less than $100 she will be billed by the genetic testing laboratory.   PLAN: After considering the risks, benefits, and limitations, Laura Mcintyre provided informed consent to pursue genetic testing and the blood sample was sent to [facility] for analysis of the common hereditary cancer panel. Results should be available within approximately 2-3 weeks' time, at which point they will be disclosed by telephone to Laura Mcintyre, as will any additional recommendations warranted by these results. Laura Mcintyre will receive a summary of her genetic counseling visit and a copy of her results once available. This information will also be available in Epic.   Lastly, we encouraged Laura Mcintyre to remain in contact with cancer genetics annually so that we can continuously update the family history and inform her of any changes in cancer genetics and testing that may be of benefit for this family.   Laura Mcintyre questions were answered to her satisfaction today. Our contact information was provided should additional questions or concerns arise. Thank you for  the referral and allowing [place] to share in the care of your patient.   Kelley Polinsky P. Fl[place]ne [place], [place], [facility] Certified Genetic Counselor [email or web address] phone: [phone]  The patient was seen for a total of 25 minutes in face-to-face genetic counseling.  This patient was discussed with Drs. Magrinat, Lindi Adie and/or Burr Medico who agrees with the above.    _______________________________________________________________________ For Office Staff:  Number of people involved in session: 1 Was an Intern/ student involved with case: no

## 2019-05-16 NOTE — Telephone Encounter (Signed)
Scheduled appt per 7/14 los.  Spoke with patient daughter and she is aware of the appt date and time.  I called the number that was left in the appt notes from 7/14 appt with MD.

## 2019-05-17 ENCOUNTER — Encounter: Payer: Self-pay | Admitting: Hematology

## 2019-05-17 LAB — CANCER ANTIGEN 19-9: CA 19-9: 273 U/mL — ABNORMAL HIGH (ref 0–35)

## 2019-05-18 ENCOUNTER — Other Ambulatory Visit: Payer: Self-pay

## 2019-05-18 ENCOUNTER — Other Ambulatory Visit (HOSPITAL_COMMUNITY): Payer: Self-pay | Admitting: Gastroenterology

## 2019-05-18 ENCOUNTER — Telehealth: Payer: Self-pay

## 2019-05-18 DIAGNOSIS — Z9689 Presence of other specified functional implants: Secondary | ICD-10-CM

## 2019-05-18 NOTE — Telephone Encounter (Signed)
Called Laura Mcintyre to offer support and she requested that I call her daughter Laura Mcintyre for recent updates. Spoke with Laura Mcintyre who  received a call from Dr. Loletha Carrow letting her know that her temporary stent that had been placed in the ED "had not come out" and he would be contacting Dr. Watt Climes to remove stent. Laura Mcintyre unsure of what was "top priority", the liver BX or stent removal. She has left a message with Dr. Alric Quan and is awaiting his call back.

## 2019-05-18 NOTE — Progress Notes (Signed)
TB recommendations 05/17/19  US Liver BX , [facility] testing.

## 2019-05-19 ENCOUNTER — Telehealth: Payer: Self-pay

## 2019-05-19 NOTE — Telephone Encounter (Signed)
Left voice message for patient's daughter Juliann Pulse regarding lab results, per Dr. Burr Medico LFTs have improved, no other concerns.

## 2019-05-19 NOTE — Telephone Encounter (Signed)
-----   Message from Truitt Merle, MD sent at 05/17/2019 11:19 PM EDT ----- Please let pt's daughter know her lab results, LFTs improved, no other concerns, thanks  Truitt Merle  05/17/2019

## 2019-05-21 ENCOUNTER — Other Ambulatory Visit: Payer: Self-pay | Admitting: Radiology

## 2019-05-23 ENCOUNTER — Ambulatory Visit: Payer: Self-pay | Admitting: Genetic Counselor

## 2019-05-23 ENCOUNTER — Ambulatory Visit (HOSPITAL_COMMUNITY): Payer: Medicare Other

## 2019-05-23 ENCOUNTER — Ambulatory Visit (HOSPITAL_COMMUNITY)
Admission: RE | Admit: 2019-05-23 | Discharge: 2019-05-23 | Disposition: A | Discharge status: Home / Self Care | Payer: Medicare Other | Source: Ambulatory Visit | Attending: Hematology | Admitting: Hematology

## 2019-05-23 ENCOUNTER — Ambulatory Visit (HOSPITAL_COMMUNITY)
Admission: RE | Admit: 2019-05-23 | Discharge: 2019-05-23 | Disposition: A | Discharge status: Home / Self Care | Payer: Medicare Other | Source: Ambulatory Visit | Attending: Gastroenterology | Admitting: Gastroenterology

## 2019-05-23 ENCOUNTER — Encounter: Payer: Self-pay | Admitting: Genetic Counselor

## 2019-05-23 ENCOUNTER — Telehealth: Payer: Self-pay | Admitting: Genetic Counselor

## 2019-05-23 ENCOUNTER — Encounter (HOSPITAL_COMMUNITY): Payer: Self-pay

## 2019-05-23 ENCOUNTER — Other Ambulatory Visit: Payer: Self-pay

## 2019-05-23 DIAGNOSIS — Z79899 Other long term (current) drug therapy: Secondary | ICD-10-CM | POA: Insufficient documentation

## 2019-05-23 DIAGNOSIS — Z7901 Long term (current) use of anticoagulants: Secondary | ICD-10-CM | POA: Diagnosis not present

## 2019-05-23 DIAGNOSIS — Z7982 Long term (current) use of aspirin: Secondary | ICD-10-CM | POA: Diagnosis not present

## 2019-05-23 DIAGNOSIS — I10 Essential (primary) hypertension: Secondary | ICD-10-CM | POA: Diagnosis not present

## 2019-05-23 DIAGNOSIS — Z8673 Personal history of transient ischemic attack (TIA), and cerebral infarction without residual deficits: Secondary | ICD-10-CM | POA: Insufficient documentation

## 2019-05-23 DIAGNOSIS — Z1379 Encounter for other screening for genetic and chromosomal anomalies: Secondary | ICD-10-CM | POA: Insufficient documentation

## 2019-05-23 DIAGNOSIS — C250 Malignant neoplasm of head of pancreas: Secondary | ICD-10-CM | POA: Diagnosis present

## 2019-05-23 DIAGNOSIS — K8681 Exocrine pancreatic insufficiency: Secondary | ICD-10-CM | POA: Diagnosis not present

## 2019-05-23 DIAGNOSIS — E785 Hyperlipidemia, unspecified: Secondary | ICD-10-CM | POA: Insufficient documentation

## 2019-05-23 DIAGNOSIS — R160 Hepatomegaly, not elsewhere classified: Secondary | ICD-10-CM | POA: Diagnosis not present

## 2019-05-23 DIAGNOSIS — Z9689 Presence of other specified functional implants: Secondary | ICD-10-CM

## 2019-05-23 LAB — CBC WITH DIFFERENTIAL/PLATELET
Abs Immature Granulocytes: 0.01 10*3/uL (ref 0.00–0.07)
Basophils Absolute: 0.1 10*3/uL (ref 0.0–0.1)
Basophils Relative: 2 %
Eosinophils Absolute: 0.3 10*3/uL (ref 0.0–0.5)
Eosinophils Relative: 5 %
HCT: 37.1 % (ref 36.0–46.0)
Hemoglobin: 12.5 g/dL (ref 12.0–15.0)
Immature Granulocytes: 0 %
Lymphocytes Relative: 40 %
Lymphs Abs: 2.4 10*3/uL (ref 0.7–4.0)
MCH: 30.9 pg (ref 26.0–34.0)
MCHC: 33.7 g/dL (ref 30.0–36.0)
MCV: 91.8 fL (ref 80.0–100.0)
Monocytes Absolute: 0.7 10*3/uL (ref 0.1–1.0)
Monocytes Relative: 11 %
Neutro Abs: 2.5 10*3/uL (ref 1.7–7.7)
Neutrophils Relative %: 42 %
Platelets: 380 10*3/uL (ref 150–400)
RBC: 4.04 MIL/uL (ref 3.87–5.11)
RDW: 11.8 % (ref 11.5–15.5)
WBC: 6 10*3/uL (ref 4.0–10.5)
nRBC: 0 % (ref 0.0–0.2)

## 2019-05-23 LAB — COMPREHENSIVE METABOLIC PANEL
ALT: 78 U/L — ABNORMAL HIGH (ref 0–44)
AST: 45 U/L — ABNORMAL HIGH (ref 15–41)
Albumin: 3.9 g/dL (ref 3.5–5.0)
Alkaline Phosphatase: 92 U/L (ref 38–126)
Anion gap: 11 (ref 5–15)
BUN: 13 mg/dL (ref 8–23)
CO2: 26 mmol/L (ref 22–32)
Calcium: 9.7 mg/dL (ref 8.9–10.3)
Chloride: 101 mmol/L (ref 98–111)
Creatinine, Ser: 0.89 mg/dL (ref 0.44–1.00)
GFR calc Af Amer: >60 mL/min (ref >60)
GFR calc non Af Amer: >60 mL/min (ref >60)
Glucose, Bld: 112 mg/dL — ABNORMAL HIGH (ref 70–99)
Potassium: 4.2 mmol/L (ref 3.5–5.1)
Sodium: 138 mmol/L (ref 135–145)
Total Bilirubin: 1.4 mg/dL — ABNORMAL HIGH (ref 0.3–1.2)
Total Protein: 7.4 g/dL (ref 6.5–8.1)

## 2019-05-23 LAB — PROTIME-INR
INR: 1 (ref 0.8–1.2)
Prothrombin Time: 13.2 seconds (ref 11.4–15.2)

## 2019-05-23 MED ORDER — SODIUM CHLORIDE 0.9 % IV SOLN
INTRAVENOUS | Status: DC
  Administered 2019-05-23: 12:00:00 via INTRAVENOUS

## 2019-05-23 MED ORDER — FENTANYL CITRATE (PF) 100 MCG/2ML IJ SOLN
INTRAMUSCULAR | Status: AC
  Filled 2019-05-23: qty 2

## 2019-05-23 MED ORDER — GELATIN ABSORBABLE 12-7 MM EX MISC
CUTANEOUS | Status: AC
  Filled 2019-05-23: qty 1

## 2019-05-23 MED ORDER — MIDAZOLAM HCL 2 MG/2ML IJ SOLN
INTRAMUSCULAR | Status: AC | PRN
  Administered 2019-05-23 (×4): 0.5 mg via INTRAVENOUS

## 2019-05-23 MED ORDER — LIDOCAINE HCL (PF) 1 % IJ SOLN
INTRAMUSCULAR | Status: AC | PRN
  Administered 2019-05-23: 10 mL

## 2019-05-23 MED ORDER — FENTANYL CITRATE (PF) 100 MCG/2ML IJ SOLN
INTRAMUSCULAR | Status: AC | PRN
  Administered 2019-05-23 (×4): 25 ug via INTRAVENOUS

## 2019-05-23 MED ORDER — MIDAZOLAM HCL 2 MG/2ML IJ SOLN
INTRAMUSCULAR | Status: AC
  Filled 2019-05-23: qty 2

## 2019-05-23 MED ORDER — LIDOCAINE HCL 1 % IJ SOLN
INTRAMUSCULAR | Status: AC
  Filled 2019-05-23: qty 20

## 2019-05-23 NOTE — Consult Note (Signed)
Chief Complaint: Patient was seen in consultation today for image guided liver lesion biopsy  Referring Physician(s): Feng,Yan  Supervising Physician: Corrie Mckusick  Patient Status: [facility] - Out-pt  History of Present Illness: Laura Mcintyre is a 78 y.o. female with history of newly diagnosed pancreatic carcinoma, status post biliary sphincterotomy/brushing/stenting on 05/05/2019.  She has one plastic stent in the ventral pancreatic duct and an uncovered metal stent in the common bile duct.  Recent imaging has revealed 2 lesions in the right hepatic lobe suspicious for metastatic disease as well as pulmonary nodules.  She presents today for image guided liver lesion biopsy for further evaluation.  Additional medical history as below.  Past Medical History:  Diagnosis Date  . Abnormal EKG 06/05/2015   Overview:  stress echo is negative for ischemia Oct 2011  . Abnormal electrocardiography 06/05/2015   Overview:  Overview:  stress echo is negative for ischemia Oct 2011  . Essential hypertension 06/05/2015  . 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 Family history of breast cancer   . Hallux valgus of right foot 01/16/2016  . 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 Hyperlipidemia 06/05/2015  . Stroke ([place])   . 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 ABDOMINAL HYSTERECTOMY    . APPENDECTOMY    . BILIARY BRUSHING  05/05/2019   Procedure: BILIARY BRUSHING;  Surgeon: Clarene Essex, MD;  Location: [facility] ENDOSCOPY;  Service: Endoscopy;;  . BILIARY STENT PLACEMENT  05/05/2019   Procedure: BILIARY STENT PLACEMENT;  Surgeon: Clarene Essex, MD;  Location: [place];  Service: Endoscopy;;  . ENDOSCOPIC RETROGRADE CHOLANGIOPANCREATOGRAPHY (ERCP) WITH PROPOFOL N/A 05/05/2019   Procedure: ENDOSCOPIC RETROGRADE CHOLANGIOPANCREATOGRAPHY (ERCP) WITH PROPOFOL;  Surgeon: Clarene Essex, MD;  Location: [place];  Service: Endoscopy;  Laterality: N/A;  . KNEE SURGERY    .  PANCREATIC STENT PLACEMENT  05/05/2019   Procedure: PANCREATIC STENT PLACEMENT;  Surgeon: Clarene Essex, MD;  Location: [facility] ENDOSCOPY;  Service: Endoscopy;;  . Joan Mayans  05/05/2019   Procedure: Joan Mayans;  Surgeon: Clarene Essex, MD;  Location: [facility] ENDOSCOPY;  Service: Endoscopy;;    Allergies: Aloe, Hydralazine hcl, Atorvastatin, Celecoxib, Colesevelam, Ezetimibe, Fosinopril, Ibuprofen, Morphine, Oxycodone, Simvastatin, Statins, and Triamcinolone acetonide  Medications: Prior to Admission medications   Medication Sig Start Date End Date Taking? Authorizing Provider  aspirin 81 MG tablet Take 1 tablet (81 mg total) by mouth daily. Patient taking differently: Take 81 mg by mouth at bedtime.  03/21/18   Richardo Priest, MD  calcium-vitamin D (OSCAL 500/200 D-3) 500-200 MG-UNIT tablet Take 1 tablet by mouth daily.     [provider]  estrogens, conjugated, (PREMARIN) 1.25 MG tablet Take 1.25 mg by mouth daily.    [provider]  furosemide (LASIX) 40 MG tablet Take 1 tablet (40 mg total) by mouth daily. 03/17/19   Richardo Priest, MD  hydroxychloroquine (PLAQUENIL) 200 MG tablet Take 100 mg by mouth 2 (two) times daily. 05/18/17   [provider]  lisinopril (ZESTRIL) 40 MG tablet Take 1 tablet (40 mg total) by mouth 2 (two) times daily. Patient taking differently: Take 40 mg by mouth See admin instructions. Take 40mg  in the morning and 20mg  at bedtime 04/26/19   Richardo Priest, MD  metoprolol tartrate (LOPRESSOR) 50 MG tablet Take 0.5 tablets (25 mg total) by mouth daily. 09/19/18   Richardo Priest, MD  Omega-3 Fatty Acids (FISH OIL PO) Take 1 tablet by mouth  2 (two) times daily.     [provider]  potassium chloride SA (K-DUR,KLOR-CON) 20 MEQ tablet Take 1 tablet (20 mEq total) by mouth daily. 09/19/18   Richardo Priest, MD     Family History  Problem Relation Age of Onset  . Heart failure Mother   . Stroke Mother   . Macular degeneration Mother   .  Cancer Sister 68       breast cancer  . Heart attack Brother   . Lung cancer Brother 87  . Cancer Sister 24       breast cancer  . Bone cancer Sister     Social History   Socioeconomic History  . Marital status: Legally Separated    Spouse name: Not on file  . Number of children: Not on file  . Years of education: Not on file  . Highest education level: Not on file  Occupational History  . Not on file  Social Needs  . Financial resource strain: Not on file  . Food insecurity    Worry: Not on file    Inability: Not on file  . Transportation needs    Medical: Not on file    Non-medical: Not on file  Tobacco Use  . Smoking status: Never Smoker  . Smokeless tobacco: Never Used  Substance and Sexual Activity  . Alcohol use: No  . Drug use: No  . Sexual activity: Not on file  Lifestyle  . Physical activity    Days per week: Not on file    Minutes per session: Not on file  . Stress: Not on file  Relationships  . Social Herbalist on phone: Not on file    Gets together: Not on file    Attends religious service: Not on file    Active member of club or organization: Not on file    Attends meetings of clubs or organizations: Not on file    Relationship status: Not on file  Other Topics Concern  . Not on file  Social History Narrative  . Not on file     Review of Systems currently denies fever, headache, chest pain, dyspnea, cough, worsening abdominal pain, vomiting or abnormal bleeding.  She does have intermittent back pain, right hip discomfort, occasional nausea and weight loss  Vital Signs:Blood Pressure 175/80, heart rate 63, respirations 16, O2 sat 100% room air, temp 98.5   Physical Exam awake, alert.  Chest clear to auscultation bilaterally.  Heart with regular rate and rhythm.  Abdomen soft, positive bowel sounds, currently nontender.  Trace pretibial edema bilaterally.  Imaging: Dg Abd 1 View  Result Date: 05/16/2019 CLINICAL DATA:  Evaluate for  passage of plastic biliary stent EXAM: ABDOMEN - 1 VIEW COMPARISON:  ERCP 75/08/2584 FINDINGS: Metallic stent is noted in the right upper quadrant compatible with biliary positioning. Small metallic clips noted in the right lower quadrant, stable since CT from 05/03/2019. Plastic stent is seen overlying the lower portion of the metallic stent, coursing vertically, thin horizontally near the midportion of the metallic stent. This could be within the common bile duct or the pancreatic duct. Nonobstructive bowel gas pattern. IMPRESSION: Both plastic and metallic stents noted in the right abdomen. Nonobstructive bowel gas pattern. Electronically Signed   By: Rolm Baptise M.D.   On: 05/16/2019 16:48   Ct Chest Wo Contrast  Result Date: 05/05/2019 CLINICAL DATA:  Inpatient. Dyspnea. Weight loss. Obstructive jaundice with reported pancreatic mass. EXAM: CT CHEST WITHOUT CONTRAST  TECHNIQUE: Multidetector CT imaging of the chest was performed following the standard protocol without IV contrast. COMPARISON:  None. FINDINGS: Cardiovascular: Normal heart size. No significant pericardial effusion/thickening. Left anterior descending coronary atherosclerosis. Mildly atherosclerotic nonaneurysmal thoracic aorta. Normal caliber pulmonary arteries. Mediastinum/Nodes: No discrete thyroid nodules. Unremarkable esophagus. No pathologically enlarged axillary, mediastinal or hilar lymph nodes, noting limited sensitivity for the detection of hilar adenopathy on this noncontrast study. Lungs/Pleura: No pneumothorax. No pleural effusion. No acute consolidative airspace disease or lung masses. A few (at least 5) solid pulmonary nodules scattered in both lungs, largest 6 mm in the left lower lobe (series 4/image 92). Upper abdomen: Pneumobilia scattered throughout the intrahepatic bile ducts, most prominent in the left liver lobe. Partially visualized CBD stent, the distal tip of which is not seen on this scan. Pancreas is incompletely  visualized on this scan. Mild fullness of the visualized central right renal collecting system. Musculoskeletal: No aggressive appearing focal osseous lesions. Mild thoracic spondylosis. IMPRESSION: 1. Few scattered solid pulmonary nodules in both lungs, largest 6 mm, indeterminate for pulmonary metastases. Recommend attention on follow-up chest CT in 3 months. These nodules are below PET resolution at this time. 2. No thoracic adenopathy or other potential findings of metastatic disease in the chest. 3. Pneumobilia in intrahepatic bile ducts. Partially visualized CBD stent. Pancreas incompletely visualized on this noncontrast chest CT study. 4. One vessel coronary atherosclerosis. Aortic Atherosclerosis (ICD10-I70.0). Electronically Signed   By: Ilona Sorrel M.D.   On: 05/05/2019 18:41   Dg Ercp Biliary & Pancreatic Ducts  Result Date: 05/05/2019 CLINICAL DATA:  78 year old female with pancreatic mass and obstructed jaundice EXAM: ERCP TECHNIQUE: Multiple spot images obtained with the fluoroscopic device and submitted for interpretation post-procedure. FLUOROSCOPY TIME:  Fluoroscopy Time:  1 minutes 51 seconds Radiation Exposure Index (if provided by the fluoroscopic device): 41.6 Number of Acquired Spot Images: 0 COMPARISON:  None. FINDINGS: Two intraoperative saved images demonstrate a flexible endoscope in the descending duodenum with deep wire cannulation of the right intrahepatic ducts. Cholangiogram demonstrates marked dilation of the common bile duct with abrupt tapering. On the final image, a metallic biliary stent has been placed. IMPRESSION: ERCP demonstrates malignant mid and distal biliary obstruction. Endoscopic stent placement. These images were submitted for radiologic interpretation only. Please see the procedural report for the amount of contrast and the fluoroscopy time utilized. Electronically Signed   By: Jacqulynn Cadet M.D.   On: 05/05/2019 13:09    Labs:  CBC: Recent Labs     05/03/19 1640 05/06/19 0342 05/16/19 1501  WBC 6.0 7.0 6.4  HGB 12.5 10.9* 12.0  HCT 37.5 32.2* 36.0  PLT 266 231 428*    COAGS: Recent Labs    05/04/19 0748  INR 1.1    BMP: Recent Labs    05/03/19 1640 05/06/19 0342 05/16/19 1501  NA 136 137 138  K 3.6 3.8 4.6  CL 99 102 101  CO2 25 26 28   GLUCOSE 123* 213* 120*  BUN 15 11 14   CALCIUM 9.5 8.7* 9.4  CREATININE 0.98 0.94 0.85  GFRNONAA 55* 58* >60  GFRAA >60 >60 >60    LIVER FUNCTION TESTS: Recent Labs    05/03/19 1640 05/04/19 0506 05/06/19 0342 05/16/19 1501  BILITOT 8.0* 7.6* 4.1* 1.3*  AST 455* 431* 217* 78*  ALT 614* 586* 451* 171*  ALKPHOS 247* 222* 188* 135*  PROT 7.1 6.5 5.8* 6.8  ALBUMIN 3.8 3.2* 2.8* 3.5    TUMOR MARKERS: No results for input(s):  AFPTM, CEA, CA199, CHROMGRNA in the last 8760 hours.  Assessment and Plan: 78 y.o. female with history of newly diagnosed pancreatic carcinoma, status post biliary sphincterotomy/brushing/stenting on 05/05/2019.  She has one plastic stent in the ventral pancreatic duct and an uncovered metal stent in the common bile duct.  Recent imaging has revealed 2 lesions in the right hepatic lobe suspicious for metastatic disease as well as pulmonary nodules.  She presents today for image guided liver lesion biopsy for further evaluation.Risks and benefits of procedure was discussed with the patient  including, but not limited to bleeding, infection, damage to adjacent structures or low yield requiring additional tests.  All of the questions were answered and there is agreement to proceed.  Consent signed and in chart.  LABS PENDING    Thank you for this interesting consult.  I greatly enjoyed meeting Laura Mcintyre and look forward to participating in their care.  A copy of this report was sent to the requesting provider on this date.  Electronically Signed: D. Rowe Robert, PA-C 05/23/2019, 11:14 AM   I spent a total of  25 minutes   in face to face in  clinical consultation, greater than 50% of which was counseling/coordinating care for image guided liver lesion biopsy

## 2019-05-23 NOTE — Procedures (Signed)
Interventional Radiology Procedure Note  Procedure: US guided liver mass biopsy.  Complications: None Recommendations:  - 2 hour dc home - advance diet  -Ok to shower tomorrow - Do not submerge for 7 days - Routine care   Signed,  Dulcy Fanny. Earleen [place], DO

## 2019-05-23 NOTE — Progress Notes (Signed)
HPI:  [name] was previously seen in the [facility] due to a personal and family history of cancer and concerns regarding a hereditary predisposition to cancer. Please refer to our prior cancer genetics clinic note for more information regarding our discussion, assessment and recommendations, at the time. [name] recent genetic test results were disclosed to her, as were recommendations warranted by these results. These results and recommendations are discussed in more detail below.  CANCER HISTORY:  Oncology History  Pancreatic cancer ([place])  05/05/2019 Initial Biopsy   Diagnosis 05/05/19  BILE DUCT BRUSHING (SPECIMEN 1 OF 1, COLLECTED 05/05/2019): MALIGNANT CELLS CONSISTENT WITH ADENOCARCINOMA.   05/05/2019 Procedure   ERCP by Dr. Watt Climes 05/05/19 IMPRESSION -The major papilla appeared congested. 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 This stricture was treated with biliary sphincterotomy brushing and stenting. - One plastic stent was placed into the ventral pancreatic duct. - A sphincterotomy was performed. - Cells for cytology obtained in the lower third of the main duct. - One uncovered metal stent was placed into the common bile duct.   05/05/2019 Imaging   CT Chest WO contrast 05/05/19  IMPRESSION: 1. Few scattered solid pulmonary nodules in both lungs, largest 6 mm, indeterminate for pulmonary metastases. Recommend attention on follow-up chest CT in 3 months. These nodules are below PET resolution at this time. 2. No thoracic adenopathy or other potential findings of metastatic disease in the chest. 3. Pneumobilia in intrahepatic bile ducts. Partially visualized CBD stent. Pancreas incompletely visualized on this noncontrast chest CT study. 4. One vessel coronary atherosclerosis.   Aortic Atherosclerosis (ICD10-I70.0).   05/16/2019 Initial Diagnosis   Pancreatic  cancer ([place])   05/22/2019 Genetic Testing   Negative genetic testing on the common hereditary cancer syndrome.  The Common Hereditary Gene Panel offered by [facility] includes sequencing and/or deletion duplication testing of the following 48 genes: APC, ATM, AXIN2, BARD1, BMPR1A, BRCA1, BRCA2, BRIP1, CDH1, CDK4, CDKN2A (p14ARF), CDKN2A (p16INK4a), CHEK2, CTNNA1, DICER1, EPCAM (Deletion/duplication testing only), GREM1 (promoter region deletion/duplication testing only), KIT, MEN1, MLH1, MSH2, MSH3, MSH6, MUTYH, NBN, NF1, NHTL1, PALB2, PDGFRA, PMS2, POLD1, POLE, PTEN, RAD50, RAD51C, RAD51D, RNF43, SDHB, SDHC, SDHD, SMAD4, SMARCA4. STK11, TP53, TSC1, TSC2, and VHL.  The following genes were evaluated for sequence changes only: SDHA and HOXB13 c.251G>A variant only. The report date is May 22, 2019.     FAMILY HISTORY:  We obtained a detailed, 4-generation family history.  Significant diagnoses are listed below: Family History  Problem Relation Age of Onset  . Heart failure Mother   . Stroke Mother   . Macular degeneration Mother   . Cancer Sister 49       breast cancer  . Heart attack Brother   . Lung cancer Brother 45  . Cancer Sister 38       breast cancer  . Bone cancer Sister     The patient has a son and daughter who are cancer free.  She has 10 siblings.  One brother died of lung cancer and had a son who died of cancer, and two sisters had breast cancer.  One sister had bilateral breast cancer in her 63's, and the other had breast cancer in her 39's.  A third sister had bone cancer that was diagnosed one week before she died.Both parents are deceased.  The patient's mother had 12 siblings, none were reported to have cancer.  There is no other known history of cancer.  The patient's father had 14 siblings.  None were reported to have cancer.  There is no other reported history of cancer.  [name] is unaware of previous family history of genetic testing for hereditary cancer risks.  Patient's maternal ancestors are of Caucasian descent, and paternal ancestors are of Caucasian descent. There is no reported Ashkenazi Jewish ancestry. There is no known consanguinity.    GENETIC TEST RESULTS: Genetic testing reported out on May 22, 2019 through the Common hereditary cancer panel found no pathogenic mutations. The Common Hereditary Gene Panel offered by [facility] includes sequencing and/or deletion duplication testing of the following 48 genes: APC, ATM, AXIN2, BARD1, BMPR1A, BRCA1, BRCA2, BRIP1, CDH1, CDK4, CDKN2A (p14ARF), CDKN2A (p16INK4a), CHEK2, CTNNA1, DICER1, EPCAM (Deletion/duplication testing only), GREM1 (promoter region deletion/duplication testing only), KIT, MEN1, MLH1, MSH2, MSH3, MSH6, MUTYH, NBN, NF1, NHTL1, PALB2, PDGFRA, PMS2, POLD1, POLE, PTEN, RAD50, RAD51C, RAD51D, RNF43, SDHB, SDHC, SDHD, SMAD4, SMARCA4. STK11, TP53, TSC1, TSC2, and VHL.  The following genes were evaluated for sequence changes only: SDHA and HOXB13 c.251G>A variant only. The test report has been scanned into EPIC and is located under the Molecular Pathology section of the Results Review tab.  A portion of the result report is included below for reference.     We discussed with [name] that because current genetic testing is not perfect, it is possible there may be a gene mutation in one of these genes that current testing cannot detect, but that chance is small.  We also discussed, that there could be another gene that has not yet been discovered, or that we have not yet tested, that is responsible for the cancer diagnoses in the family. It is also possible there is a hereditary cause for the cancer in the family that [name] did not inherit and therefore was not identified in her testing.  Therefore, it is important to remain in touch with cancer genetics in the future so that we can continue to offer [name] the most up to date genetic testing.   ADDITIONAL GENETIC TESTING: We discussed  with [name] that there are other genes that are associated with increased cancer risk that can be analyzed. Should [name] wish to pursue additional genetic testing, we are happy to discuss and coordinate this testing, at any time.    CANCER SCREENING RECOMMENDATIONS: [name] test result is considered negative (normal).  This means that we have not identified a hereditary cause for her personal and family history of cancer at this time. Most cancers happen by chance and this negative test suggests that her cancer may fall into this category.    While reassuring, this does not definitively rule out a hereditary predisposition to cancer. It is still possible that there could be genetic mutations that are undetectable by current technology. There could be genetic mutations in genes that have not been tested or identified to increase cancer risk.  Therefore, it is recommended she continue to follow the cancer management and screening guidelines provided by her oncology and primary healthcare provider.   An individual's cancer risk and medical management are not determined by genetic test results alone. Overall cancer risk assessment incorporates additional factors, including personal medical history, family history, and any available genetic information that may result in a personalized plan for cancer prevention and surveillance  RECOMMENDATIONS FOR FAMILY MEMBERS:  Individuals in this family might be at some increased risk of developing cancer, over  the general population risk, simply due to the family history of cancer.  We recommended women in this family have a yearly mammogram beginning at age 66, or 2 years younger than the earliest onset of cancer, an annual clinical breast exam, and perform monthly breast self-exams. Women in this family should also have a gynecological exam as recommended by their primary provider. All family members should have a colonoscopy by age 8.  FOLLOW-UP:  Lastly, we discussed with [name] that cancer genetics is a rapidly advancing field and it is possible that new genetic tests will be appropriate for her and/or her family members in the future. We encouraged her to remain in contact with cancer genetics on an annual basis so we can update her personal and family histories and let her know of advances in cancer genetics that may benefit this family.   Our contact number was provided. [name] questions were answered to her satisfaction, and she knows she is welcome to call us at anytime with additional questions or concerns.   Roma Kayser, [place], [facility] Licensed, Certified Genetic Counselor [email or web address]

## 2019-05-23 NOTE — Telephone Encounter (Signed)
LM on VM at home and on cell that results are back and to please CB.

## 2019-05-23 NOTE — Discharge Instructions (Signed)
Moderate Conscious Sedation, Adult, Care After These instructions provide you with information about caring for yourself after your procedure. Your health care provider may also give you more specific instructions. Your treatment has been planned according to current medical practices, but problems sometimes occur. Call your health care provider if you have any problems or questions after your procedure. What can I expect after the procedure? After your procedure, it is common:  To feel sleepy for several hours.  To feel clumsy and have poor balance for several hours.  To have poor judgment for several hours.  To vomit if you eat too soon. Follow these instructions at home: For at least 24 hours after the procedure:   Do not: ? Participate in activities where you could fall or become injured. ? Drive. ? Use heavy machinery. ? Drink alcohol. ? Take sleeping pills or medicines that cause drowsiness. ? Make important decisions or sign legal documents. ? Take care of children on your own.  Rest. Eating and drinking  Follow the diet recommended by your health care provider.  If you vomit: ? Drink water, juice, or soup when you can drink without vomiting. ? Make sure you have little or no nausea before eating solid foods. General instructions  Have a responsible adult stay with you until you are awake and alert.  Take over-the-counter and prescription medicines only as told by your health care provider.  If you smoke, do not smoke without supervision.  Keep all follow-up visits as told by your health care provider. This is important. Contact a health care provider if:  You keep feeling nauseous or you keep vomiting.  You feel light-headed.  You develop a rash.  You have a fever. Get help right away if:  You have trouble breathing.   Needle Biopsy Liver, Care After This sheet gives you information about how to care for yourself after your procedure. Your health care  provider may also give you more specific instructions. If you have problems or questions, contact your health care provider. What can I expect after the procedure? After the procedure, it is common to have soreness, bruising, or mild pain at the puncture site. This should go away in a few days. Follow these instructions at home: Needle insertion site care   Wash your hands with soap and water before you change your bandage (dressing). If you cannot use soap and water, use hand sanitizer.  Follow instructions from your health care provider about how to take care of your puncture site. This includes: ? When and how to change your dressing. May remove dressinga and shower or bathe 24 hours.  Keep site clean and dry and replace bandaid as necessary.  Check your puncture site every day for signs of infection. Check for: ? Redness, swelling, or pain. ? Fluid or blood. ? Pus or a bad smell. ? Warmth. General instructions  Return to your normal activities as told by your health care provider. Ask your health care provider what activities are safe for you.  Do not take baths, swim, or use a hot tub until your health care provider approves. Ask your health care provider if you may take showers. You may only be allowed to take sponge baths.  Take over-the-counter and prescription medicines only as told by your health care provider.  Keep all follow-up visits as told by your health care provider. This is important. Contact a health care provider if:  You have a fever.  You have redness, swelling, or  pain at the puncture site that lasts longer than a few days.  You have fluid, blood, or pus coming from your puncture site.  Your puncture site feels warm to the touch. Get help right away if:  You have severe bleeding from the puncture site. Summary  After the procedure, it is common to have soreness, bruising, or mild pain at the puncture site. This should go away in a few days.  Check  your puncture site every day for signs of infection, such as redness, swelling, or pain.  Get help right away if you have severe bleeding from your puncture site.  No straining or lifting heavy objects for several days.  IR after hours emergency number [phone] For IR on call MD.  Ice pack for discomfort.  This information is not intended to replace advice given to you by your health care provider. Make sure you discuss any questions you have with your health care provider. Document Released: 03/05/2015 Document Revised: 12/31/2017 Document Reviewed: 11/01/2017 Elsevier Patient Education  2020 [employer].  This information is not intended to replace advice given to you by your health care provider. Make sure you discuss any questions you have with your health care provider. Document Released: 08/09/2013 Document Revised: 10/01/2017 Document Reviewed: 02/08/2016 Elsevier Patient Education  2020 [employer].

## 2019-05-23 NOTE — Telephone Encounter (Signed)
Revealed negative genetic testing.  Discussed that we do not know why she has pancreatic cancer or why there is cancer in the family. It could be due to a different gene that we are not testing, or maybe our current technology may not be able to pick something up.  It will be important for her to keep in contact with genetics to keep up with whether additional testing may be needed.  

## 2019-05-24 ENCOUNTER — Other Ambulatory Visit (HOSPITAL_COMMUNITY): Payer: Self-pay | Admitting: Hematology

## 2019-05-24 ENCOUNTER — Inpatient Hospital Stay
Admission: RE | Admit: 2019-05-24 | Discharge: 2019-05-24 | Disposition: A | Discharge status: Home / Self Care | Payer: Self-pay | Source: Ambulatory Visit | Attending: Hematology | Admitting: Hematology

## 2019-05-24 DIAGNOSIS — C801 Malignant (primary) neoplasm, unspecified: Secondary | ICD-10-CM

## 2019-05-30 ENCOUNTER — Other Ambulatory Visit: Payer: Self-pay | Admitting: Gastroenterology

## 2019-06-01 ENCOUNTER — Other Ambulatory Visit: Payer: Self-pay

## 2019-06-02 NOTE — Progress Notes (Signed)
[place]   Telephone:(336) [phone] Fax:(336) [phone]   Clinic Follow up Note   Patient Care Team: Street, Laura Mt, MD as PCP - General (Family Medicine) Laura Gavia Hilton Cork, MD as Consulting Physician (Cardiology) Laura Snipe, RN as Oncology Nurse Navigator  Date of Service:  06/07/2019  CHIEF COMPLAINT: F/u of metastatic pancreatic Cancer   SUMMARY OF ONCOLOGIC HISTORY: Oncology History  Pancreatic cancer ([place])  05/05/2019 Initial Biopsy   Diagnosis 05/05/19  BILE DUCT BRUSHING (SPECIMEN 1 OF 1, COLLECTED 05/05/2019): MALIGNANT CELLS CONSISTENT WITH ADENOCARCINOMA.   05/05/2019 Procedure   ERCP by Dr. Watt Climes 05/05/19 IMPRESSION -The major papilla appeared congested. 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 This stricture was treated with biliary sphincterotomy brushing and stenting. - One plastic stent was placed into the ventral pancreatic duct. - A sphincterotomy was performed. - Cells for cytology obtained in the lower third of the main duct. - One uncovered metal stent was placed into the common bile duct.   05/05/2019 Imaging   CT Chest WO contrast 05/05/19  IMPRESSION: 1. Few scattered solid pulmonary nodules in both lungs, largest 6 mm, indeterminate for pulmonary metastases. Recommend attention on follow-up chest CT in 3 months. These nodules are below PET resolution at this time. 2. No thoracic adenopathy or other potential findings of metastatic disease in the chest. 3. Pneumobilia in intrahepatic bile ducts. Partially visualized CBD stent. Pancreas incompletely visualized on this noncontrast chest CT study. 4. One vessel coronary atherosclerosis.   Aortic Atherosclerosis (ICD10-I70.0).   05/16/2019 Initial Diagnosis   Pancreatic cancer ([place])   05/22/2019 Genetic Testing   Negative genetic testing on the common hereditary cancer syndrome.  The Common  Hereditary Gene Panel offered by [facility] includes sequencing and/or deletion duplication testing of the following 48 genes: APC, ATM, AXIN2, BARD1, BMPR1A, BRCA1, BRCA2, BRIP1, CDH1, CDK4, CDKN2A (p14ARF), CDKN2A (p16INK4a), CHEK2, CTNNA1, DICER1, EPCAM (Deletion/duplication testing only), GREM1 (promoter region deletion/duplication testing only), KIT, MEN1, MLH1, MSH2, MSH3, MSH6, MUTYH, NBN, NF1, NHTL1, PALB2, PDGFRA, PMS2, POLD1, POLE, PTEN, RAD50, RAD51C, RAD51D, RNF43, SDHB, SDHC, SDHD, SMAD4, SMARCA4. STK11, TP53, TSC1, TSC2, and VHL.  The following genes were evaluated for sequence changes only: SDHA and HOXB13 c.251G>A variant only. The report date is May 22, 2019.   05/23/2019 Pathology Results   Diagnosis Liver, needle/core biopsy - ADENOCARCINOMA, SEE COMMENT. Microscopic Comment The morphology is consistent with a pancreaticobiliary primary. Additional studies can be performed upon request.      INTERVAL HISTORY:  Laura [place] is here for a follow up. She presents to the clinic with her daughter. She notes her liver biopsy went well and only had mild soreness for a few days afterward. She notes her appetite and eating is doing better. Overall smaller balanced meals. She has been up more daily. Her dark urine has resolved. The daughter and patient would like to think about single agent chemo. She is prepared to transfer her care to Dr. Hinton Rao.     REVIEW OF SYSTEMS:   Constitutional: Denies fevers, chills or abnormal weight loss Eyes: Denies blurriness of vision Ears, nose, mouth, throat, and face: Denies mucositis or sore throat Respiratory: Denies cough, dyspnea or wheezes Cardiovascular: Denies palpitation, chest discomfort or lower extremity swelling Gastrointestinal:  Denies nausea, heartburn or change in bowel habits Skin: Denies abnormal skin rashes Lymphatics: Denies new lymphadenopathy or easy bruising Neurological:Denies numbness, tingling or new  weaknesses Behavioral/Psych:  Mood is stable, no new changes  All other systems were reviewed with the patient and are negative.  MEDICAL HISTORY:  Past Medical History:  Diagnosis Date   Abnormal EKG 06/05/2015   Overview:  stress echo is negative for ischemia Oct 2011   Abnormal electrocardiography 06/05/2015   Overview:  Overview:  stress echo is negative for ischemia Oct 2011   Essential hypertension 06/05/2015   Familial hyperlipidemia 06/05/2015   Family history of breast cancer    Hallux valgus of right foot 01/16/2016   Hammer toes of both feet 01/16/2016   Hyperlipidemia 06/05/2015   Stroke ([place])    Tailor's bunion 01/16/2016    SURGICAL HISTORY: Past Surgical History:  Procedure Laterality Date   ABDOMINAL HYSTERECTOMY     APPENDECTOMY     BILIARY BRUSHING  05/05/2019   Procedure: BILIARY BRUSHING;  Surgeon: Clarene Essex, MD;  Location: [facility] ENDOSCOPY;  Service: Endoscopy;;   BILIARY STENT PLACEMENT  05/05/2019   Procedure: BILIARY STENT PLACEMENT;  Surgeon: Clarene Essex, MD;  Location: [place];  Service: Endoscopy;;   ENDOSCOPIC RETROGRADE CHOLANGIOPANCREATOGRAPHY (ERCP) WITH PROPOFOL N/A 05/05/2019   Procedure: ENDOSCOPIC RETROGRADE CHOLANGIOPANCREATOGRAPHY (ERCP) WITH PROPOFOL;  Surgeon: Clarene Essex, MD;  Location: [place];  Service: Endoscopy;  Laterality: N/A;   KNEE SURGERY     PANCREATIC STENT PLACEMENT  05/05/2019   Procedure: PANCREATIC STENT PLACEMENT;  Surgeon: Clarene Essex, MD;  Location: [place];  Service: Endoscopy;;   SPHINCTEROTOMY  05/05/2019   Procedure: Joan Mayans;  Surgeon: Clarene Essex, MD;  Location: [facility] ENDOSCOPY;  Service: Endoscopy;;    I have reviewed the social history and family history with the patient and they are unchanged from previous note.  ALLERGIES:  is allergic to aloe; hydralazine hcl; atorvastatin; celecoxib; colesevelam; ezetimibe; fosinopril; ibuprofen; morphine; oxycodone; simvastatin; statins; and triamcinolone  acetonide.  MEDICATIONS:  Current Outpatient Medications  Medication Sig Dispense Refill   aspirin 81 MG tablet Take 1 tablet (81 mg total) by mouth daily. (Patient taking differently: Take 81 mg by mouth at bedtime. ) 30 tablet 3   calcium-vitamin D (OSCAL 500/200 D-3) 500-200 MG-UNIT tablet Take 1 tablet by mouth daily.      estrogens, conjugated, (PREMARIN) 1.25 MG tablet Take 1.25 mg by mouth daily.     furosemide (LASIX) 40 MG tablet Take 1 tablet (40 mg total) by mouth daily. 90 tablet 1   hydroxychloroquine (PLAQUENIL) 200 MG tablet Take 100 mg by mouth 2 (two) times daily.     lisinopril (ZESTRIL) 40 MG tablet Take 1 tablet (40 mg total) by mouth 2 (two) times daily. (Patient taking differently: Take 40 mg by mouth See admin instructions. Take '40mg'$  in the morning and '20mg'$  at bedtime) 60 tablet 0   metoprolol tartrate (LOPRESSOR) 50 MG tablet Take 0.5 tablets (25 mg total) by mouth daily. 45 tablet 3   Omega-3 Fatty Acids (FISH OIL PO) Take 1 tablet by mouth 2 (two) times daily.      potassium chloride SA (K-DUR,KLOR-CON) 20 MEQ tablet Take 1 tablet (20 mEq total) by mouth daily. 90 tablet 3   No current facility-administered medications for this visit.     PHYSICAL EXAMINATION: ECOG PERFORMANCE STATUS: 1 - Symptomatic but completely ambulatory  Vitals:   06/07/19 0851  BP: (!) 141/61  Pulse: 64  Resp: 17  Temp: 98.7 F (37.1 C)  SpO2: 98%   Filed Weights   06/07/19 0851  Weight: 154 lb 9.6 oz (70.1 kg)    GENERAL:alert, no distress  and comfortable SKIN: skin color, texture, turgor are normal, no rashes or significant lesions EYES: normal, Conjunctiva are pink and non-injected, sclera clear  NECK: supple, thyroid normal size, non-tender, without nodularity LYMPH:  no palpable lymphadenopathy in the cervical, axillary  LUNGS: clear to auscultation and percussion with normal breathing effort HEART: regular rate & rhythm and no murmurs and no lower extremity  edema ABDOMEN:abdomen soft, non-tender and normal bowel sounds Musculoskeletal:no cyanosis of digits and no clubbing  NEURO: alert & oriented x 3 with fluent speech, no focal motor/sensory deficits  LABORATORY DATA:  I have reviewed the data as listed CBC Latest Ref Rng & Units 05/23/2019 05/16/2019 05/06/2019  WBC 4.0 - 10.5 K/uL 6.0 6.4 7.0  Hemoglobin 12.0 - 15.0 g/dL 12.5 12.0 10.9(L)  Hematocrit 36.0 - 46.0 % 37.1 36.0 32.2(L)  Platelets 150 - 400 K/uL 380 428(H) 231     CMP Latest Ref Rng & Units 05/23/2019 05/16/2019 05/06/2019  Glucose 70 - 99 mg/dL 112(H) 120(H) 213(H)  BUN 8 - 23 mg/dL '13 14 11  '$ Creatinine 0.44 - 1.00 mg/dL 0.89 0.85 0.94  Sodium 135 - 145 mmol/L 138 138 137  Potassium 3.5 - 5.1 mmol/L 4.2 4.6 3.8  Chloride 98 - 111 mmol/L 101 101 102  CO2 22 - 32 mmol/L '26 28 26  '$ Calcium 8.9 - 10.3 mg/dL 9.7 9.4 8.7(L)  Total Protein 6.5 - 8.1 g/dL 7.4 6.8 5.8(L)  Total Bilirubin 0.3 - 1.2 mg/dL 1.4(H) 1.3(H) 4.1(H)  Alkaline Phos 38 - 126 U/L 92 135(H) 188(H)  AST 15 - 41 U/L 45(H) 78(H) 217(H)  ALT 0 - 44 U/L 78(H) 171(H) 451(H)      RADIOGRAPHIC STUDIES: I have personally reviewed the radiological images as listed and agreed with the findings in the report. No results found.   ASSESSMENT & PLAN:  Laura [place] is a 78 y.o. female with   1. 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 -Her CT AP from [facility] showed a large mass at head of pancrease as well as two liver lesions which are highly suspicious for liver metastasis. CT chest from 05/05/19 shows small nodules in the lung which are indeterminate for pulmonary metastases, will monitor. -Her cytology from bile duct brushing shows adenocarcinoma. Based on her CT scan, this is consistent with primary pancreatic cancer. -We discussed her Liver biopsy from 05/23/19 which showed adenocarcinoma with morphology consistent with a  pancreaticobiliary primary.   -Given her metastatic disease her cancer is stage IV and no longer curable and no longer eligible for surgery. Standard treatment would include systemic chemotherapy to control her disease and prolong her life. Although she is overall healthy, given her age and previous history of stroke, I do not think she is a candidate for intensive chemotherapy. We can consider single agent Gemcitabine 2 weeks on/1 week off. I discussed response rate is only about 10% range. I discussed side effects with her and her daughter in detail.  Overall the benefit can slightly prolong her life and improve her quality of life by controlling her disease. However, she is asymptomatic now, and values her QOL as the most important issue for her, it's reasonable to hold chemo for now, we also discussed the treatment window, and chemo will not be helpful if she waited until very symptomatic.  -Her genetic testing was negative, she is not a candidate for PARP inhibitor -I have sent her liver biopsy for [facility]  genomic testing, to see if she is a candidate for immunotherapy or targeted therapy, the results will be back next week, I will call her daughter next week when the results return -Per pt's request, she will transfer her care to Dr. Hinton Rao at the [place] center, which is much more convenient for her. Pt and her daughter are very appreciative to our care       2. Obstructive Jaundice -She is s/p stent placement on 05/05/19. Her jaundice has resolved.  -She will have the plastic stent removal on 06/12/19 with Dr. Watt Climes.  -Last CMP from 05/23/19 shows improved LFTs and Tbili now 1.4.   3. Nausea, low appetite, weight loss  -secondary to #1  -S/p stent placement her appetite and eating has improved and her weight is start to increase slowly. I strongly encouraged her to continue high calorie and protein  diet and remain as active as she can.   4. Colon Polyps  -She has colon polyps  encasing clips that have not been removed yet  -After she followed up with Dr. Ouida Sills, she has postponed removal procedure.   5. HTN, H/o stroke at age 49 -She often has body swelling.  -On HTN, on Lasix and lisinopril and lopressor and aspirin  6. Rheumatoid arthritis -On Plaquenil  -Continue to follow up with her RA -She has tramadol for pain but causes brain fog so does not use often. She is also sensitive/allergic to Codeine.   7. Social support  -She lives alone and takes care of herself well  -She has good support and help from her daughter and son and friends  -I offered her the chance to speak with out social workers for counseling. She declined for now.   8. Goal of care discussion  -We previously discussed if her cancer is found to be incurable, she will have an overall poor prognosis, especially if she does not have good response to chemotherapy or progress on chemo -I recommend DNR/DNI, she will think about it. I also recommend she set up a living will.    PLAN:  -Send message to Dr. Hinton Rao to transfer care.  -I will call her daughter when her FO results return next week    No problem-specific Assessment & Plan notes found for this encounter.   No orders of the defined types were placed in this encounter.  All questions were answered. The patient knows to call the clinic with any problems, questions or concerns. No barriers to learning was detected. I spent 20 minutes counseling the patient face to face. The total time spent in the appointment was 25 minutes and more than 50% was on counseling and review of test results     Truitt Merle, MD 06/07/2019   I, Joslyn Devon, am acting as scribe for Truitt Merle, MD.   I have reviewed the above documentation for accuracy and completeness, and I agree with the above.

## 2019-06-07 ENCOUNTER — Telehealth: Payer: Self-pay | Admitting: Hematology

## 2019-06-07 ENCOUNTER — Encounter: Payer: Self-pay | Admitting: Hematology

## 2019-06-07 ENCOUNTER — Other Ambulatory Visit: Payer: Self-pay

## 2019-06-07 ENCOUNTER — Inpatient Hospital Stay: Payer: Medicare Other | Attending: Hematology | Admitting: Hematology

## 2019-06-07 VITALS — BP 141/61 | HR 64 | Temp 98.7°F | Resp 17 | Ht 65.0 in | Wt 154.6 lb

## 2019-06-07 DIAGNOSIS — I10 Essential (primary) hypertension: Secondary | ICD-10-CM | POA: Insufficient documentation

## 2019-06-07 DIAGNOSIS — M069 Rheumatoid arthritis, unspecified: Secondary | ICD-10-CM | POA: Insufficient documentation

## 2019-06-07 DIAGNOSIS — C787 Secondary malignant neoplasm of liver and intrahepatic bile duct: Secondary | ICD-10-CM | POA: Diagnosis not present

## 2019-06-07 DIAGNOSIS — R110 Nausea: Secondary | ICD-10-CM | POA: Insufficient documentation

## 2019-06-07 DIAGNOSIS — K831 Obstruction of bile duct: Secondary | ICD-10-CM | POA: Insufficient documentation

## 2019-06-07 DIAGNOSIS — C250 Malignant neoplasm of head of pancreas: Secondary | ICD-10-CM | POA: Insufficient documentation

## 2019-06-07 NOTE — Telephone Encounter (Signed)
Per 8/5 los F/u open.

## 2019-06-08 ENCOUNTER — Other Ambulatory Visit: Payer: Self-pay

## 2019-06-08 ENCOUNTER — Telehealth: Payer: Self-pay

## 2019-06-08 ENCOUNTER — Encounter (HOSPITAL_COMMUNITY): Payer: Self-pay | Admitting: *Deleted

## 2019-06-08 ENCOUNTER — Other Ambulatory Visit (HOSPITAL_COMMUNITY)
Admission: RE | Admit: 2019-06-08 | Discharge: 2019-06-08 | Disposition: A | Discharge status: Home / Self Care | Payer: Medicare Other | Source: Ambulatory Visit | Attending: Gastroenterology | Admitting: Gastroenterology

## 2019-06-08 DIAGNOSIS — Z20828 Contact with and (suspected) exposure to other viral communicable diseases: Secondary | ICD-10-CM | POA: Diagnosis not present

## 2019-06-08 DIAGNOSIS — Z01812 Encounter for preprocedural laboratory examination: Secondary | ICD-10-CM | POA: Insufficient documentation

## 2019-06-08 LAB — SARS CORONAVIRUS 2 (TAT 6-24 HRS): SARS Coronavirus 2: NEGATIVE

## 2019-06-08 NOTE — Progress Notes (Signed)
Pt scheduled for EGD with Dr. Watt Climes on 06/12/19.. Pre-procedure phone call made. Left voice message for a  return call.

## 2019-06-08 NOTE — Telephone Encounter (Signed)
Faxed referral to Dr. Hosie Poisson at [facility].  Patient lives in [place] which is much closer for them.  This was faxed to [phone] requesting she see her the week of 8/17. Sent to HIM for scan.

## 2019-06-12 ENCOUNTER — Ambulatory Visit (HOSPITAL_COMMUNITY): Payer: Medicare Other | Admitting: Registered Nurse

## 2019-06-12 ENCOUNTER — Encounter (HOSPITAL_COMMUNITY)
Admission: RE | Disposition: A | Discharge status: Home / Self Care | Payer: Self-pay | Source: Home / Self Care | Attending: Gastroenterology

## 2019-06-12 ENCOUNTER — Encounter (HOSPITAL_COMMUNITY): Payer: Self-pay | Admitting: *Deleted

## 2019-06-12 ENCOUNTER — Ambulatory Visit (HOSPITAL_COMMUNITY)
Admission: RE | Admit: 2019-06-12 | Discharge: 2019-06-12 | Disposition: A | Discharge status: Home / Self Care | Payer: Medicare Other | Attending: Gastroenterology | Admitting: Gastroenterology

## 2019-06-12 ENCOUNTER — Telehealth: Payer: Self-pay | Admitting: *Deleted

## 2019-06-12 DIAGNOSIS — X58XXXA Exposure to other specified factors, initial encounter: Secondary | ICD-10-CM | POA: Insufficient documentation

## 2019-06-12 DIAGNOSIS — Z4589 Encounter for adjustment and management of other implanted devices: Secondary | ICD-10-CM | POA: Diagnosis present

## 2019-06-12 DIAGNOSIS — T183XXA Foreign body in small intestine, initial encounter: Secondary | ICD-10-CM | POA: Diagnosis not present

## 2019-06-12 DIAGNOSIS — I10 Essential (primary) hypertension: Secondary | ICD-10-CM | POA: Diagnosis not present

## 2019-06-12 HISTORY — PX: ESOPHAGOGASTRODUODENOSCOPY (EGD) WITH PROPOFOL: SHX5813

## 2019-06-12 HISTORY — PX: STENT REMOVAL: SHX6421

## 2019-06-12 SURGERY — ESOPHAGOGASTRODUODENOSCOPY (EGD) WITH PROPOFOL
Anesthesia: Monitor Anesthesia Care

## 2019-06-12 MED ORDER — PROPOFOL 10 MG/ML IV BOLUS
INTRAVENOUS | Status: AC
  Filled 2019-06-12: qty 40

## 2019-06-12 MED ORDER — PROPOFOL 500 MG/50ML IV EMUL
INTRAVENOUS | Status: DC | PRN
  Administered 2019-06-12: 110 ug/kg/min via INTRAVENOUS

## 2019-06-12 MED ORDER — LACTATED RINGERS IV SOLN
INTRAVENOUS | Status: DC
  Administered 2019-06-12: 09:00:00 via INTRAVENOUS

## 2019-06-12 MED ORDER — PROPOFOL 10 MG/ML IV BOLUS
INTRAVENOUS | Status: DC | PRN
  Administered 2019-06-12: 20 mg via INTRAVENOUS

## 2019-06-12 MED ORDER — LACTATED RINGERS IV SOLN: INTRAVENOUS | Status: DC

## 2019-06-12 MED ORDER — SODIUM CHLORIDE 0.9 % IV SOLN: INTRAVENOUS | Status: DC

## 2019-06-12 SURGICAL SUPPLY — 14 items

## 2019-06-12 NOTE — Discharge Instructions (Signed)
YOU HAD AN ENDOSCOPIC PROCEDURE TODAY: Refer to the procedure report and other information in the discharge instructions given to you for any specific questions about what was found during the examination. If this information does not answer your questions, please call [facility] office at [phone] to clarify.   YOU SHOULD EXPECT: Some feelings of bloating in the abdomen. Passage of more gas than usual. Walking can help get rid of the air that was put into your GI tract during the procedure and reduce the bloating. If you had a lower endoscopy (such as a colonoscopy or flexible sigmoidoscopy) you may notice spotting of blood in your stool or on the toilet paper. Some abdominal soreness may be present for a day or two, also.  DIET: Your first meal following the procedure should be a light meal and then it is ok to progress to your normal diet. A half-sandwich or bowl of soup is an example of a good first meal. Heavy or fried foods are harder to digest and may make you feel nauseous or bloated. Drink plenty of fluids but you should avoid alcoholic beverages for 24 hours. If you had a esophageal dilation, please see attached instructions for diet.   ACTIVITY: Your care partner should take you home directly after the procedure. You should plan to take it easy, moving slowly for the rest of the day. You can resume normal activity the day after the procedure however YOU SHOULD NOT DRIVE, use power tools, machinery or perform tasks that involve climbing or major physical exertion for 24 hours (because of the sedation medicines used during the test).   SYMPTOMS TO REPORT IMMEDIATELY: A gastroenterologist can be reached at any hour. Please call [phone]  for any of the following symptoms:   Following lower endoscopy (colonoscopy, flexible sigmoidoscopy) Excessive amounts of blood in the stool  Significant tenderness, worsening of abdominal pains  Swelling of the abdomen that is new, acute  Fever of 100  or higher   Following upper endoscopy (EGD, EUS, ERCP, esophageal dilation) Vomiting of blood or coffee ground material  New, significant abdominal pain  New, significant chest pain or pain under the shoulder blades  Painful or persistently difficult swallowing  New shortness of breath  Black, tarry-looking or red, bloody stools  FOLLOW UP:  If any biopsies were taken you will be contacted by phone or by letter within the next 1-3 weeks. Call [phone]  if you have not heard about the biopsies in 3 weeks.  Please also call with any specific questions about appointments or follow up tests. Begin with soft solid diet advance as tolerated and call me if I can be of any further assistance going forward from a GI standpoint particularly your bile duct

## 2019-06-12 NOTE — Anesthesia Preprocedure Evaluation (Addendum)
Anesthesia Evaluation  Patient identified by MRN, date of birth, ID band Patient awake    Reviewed: Allergy & Precautions, NPO status , Patient's Chart, lab work & pertinent test results  History of Anesthesia Complications Negative for: history of anesthetic complications  Airway Mallampati: III  TM Distance: >3 FB Neck ROM: Limited    Dental  (+) Upper Dentures, Partial Lower   Pulmonary neg pulmonary ROS,    breath sounds clear to auscultation       Cardiovascular hypertension, Pt. on medications and Pt. on home beta blockers (-) angina Rhythm:Regular Rate:Bradycardia     Neuro/Psych CVA, No Residual Symptoms negative psych ROS   GI/Hepatic Neg liver ROS,  Pancreatic stent in situ    Endo/Other  negative endocrine ROS  Renal/GU negative Renal ROS     Musculoskeletal negative musculoskeletal ROS (+)   Abdominal   Peds  Hematology negative hematology ROS (+)   Anesthesia Other Findings   Reproductive/Obstetrics                            Anesthesia Physical Anesthesia Plan  ASA: III  Anesthesia Plan: MAC   Post-op Pain Management:    Induction: Intravenous  PONV Risk Score and Plan: 2 and Propofol infusion and Treatment may vary due to age or medical condition  Airway Management Planned: Nasal Cannula and Natural Airway  Additional Equipment: None  Intra-op Plan:   Post-operative Plan:   Informed Consent: I have reviewed the patients History and Physical, chart, labs and discussed the procedure including the risks, benefits and alternatives for the proposed anesthesia with the patient or authorized representative who has indicated his/her understanding and acceptance.       Plan Discussed with: CRNA and Anesthesiologist  Anesthesia Plan Comments:        Anesthesia Quick Evaluation

## 2019-06-12 NOTE — Telephone Encounter (Signed)
Records faxed to [facility] - Release [phone]

## 2019-06-12 NOTE — Op Note (Signed)
[facility] Patient Name: Laura Mcintyre Procedure Date: 06/12/2019 MRN: [id number] Attending MD: Clarene Essex , MD Date of Birth: 11-Apr-1941 CSN: [id number] Age: 78 Admit Type: Outpatient Procedure:                Upper GI endoscopy Indications:              Foreign body in the small bowel-plastic pancreatic                            stent removal Providers:                Clarene Essex, MD, Vista Lawman, RN, Ashley Jacobs,                            RN, Marguerita Merles, Technician Referring MD:              Medicines:                Propofol total dose 322 mg IV Complications:            No immediate complications. Estimated Blood Loss:     Estimated blood loss: none. Procedure:                Pre-Anesthesia Assessment:                           - Prior to the procedure, a History and Physical                            was performed, and patient medications and                            allergies were reviewed. The patient's tolerance of                            previous anesthesia was also reviewed. The risks                            and benefits of the procedure and the sedation                            options and risks were discussed with the patient.                            All questions were answered, and informed consent                            was obtained. Prior Anticoagulants: The patient has                            taken no previous anticoagulant or antiplatelet                            agents except for aspirin. ASA Grade Assessment: II                            -  A patient with mild systemic disease. After                            reviewing the risks and benefits, the patient was                            deemed in satisfactory condition to undergo the                            procedure.                           After obtaining informed consent, the endoscope was                            passed under direct vision. Throughout the                             procedure, the patient's blood pressure, pulse, and                            oxygen saturations were monitored continuously. The                            [id number]-[id number] ([id number]) Olympus gastroscope was                            introduced through the mouth, and advanced to the                            second part of duodenum. The upper GI endoscopy was                            accomplished without difficulty. The patient                            tolerated the procedure well. Scope In: Scope Out: Findings:      The larynx was normal.      The examined esophagus was normal.      The duodenal bulb was normal.      Two previously placed metal and plastic stents were seen in the second       portion of the duodenum. Stent removal was accomplished with a regular       forceps of the plastic pancreatic stent only.      The exam was otherwise without abnormality.      The entire examined stomach was normal. Impression:               - Normal larynx.                           - Normal esophagus.                           - Normal duodenal bulb.                           -  Metal and plastic stents in the duodenum. Removed                            pancreatic plastic 1.                           - The examination was otherwise normal.                           - Normal stomach. Moderate Sedation:      Not Applicable - Patient had care per Anesthesia. Recommendation:           - Soft diet today.                           - Continue present medications.                           - Return to GI clinic PRN.                           - Telephone GI clinic if symptomatic PRN. Procedure Code(s):        --- Professional ---                           [phone], Esophagogastroduodenoscopy, flexible,                            transoral; with removal of foreign body(s) Diagnosis Code(s):        --- Professional ---                           Z46.59, Encounter for fitting  and adjustment of                            other gastrointestinal appliance and device                           T18.3XXA, Foreign body in small intestine, initial                            encounter CPT copyright 2019 [employer]. All rights reserved. The codes documented in this report are preliminary and upon coder review may  be revised to meet current compliance requirements. Clarene Essex, MD 06/12/2019 10:08:53 AM This report has been signed electronically. Number of Addenda: 0

## 2019-06-12 NOTE — Anesthesia Postprocedure Evaluation (Signed)
Anesthesia Post Note  Patient: Laura Mcintyre  Procedure(s) Performed: ESOPHAGOGASTRODUODENOSCOPY (EGD) WITH PROPOFOL (N/A ) STENT REMOVAL     Patient location during evaluation: PACU Anesthesia Type: MAC Level of consciousness: awake and alert Pain management: pain level controlled Vital Signs Assessment: post-procedure vital signs reviewed and stable Respiratory status: spontaneous breathing, nonlabored ventilation and respiratory function stable Cardiovascular status: stable and blood pressure returned to baseline Anesthetic complications: no    Last Vitals:  Vitals:   06/12/19 1010 06/12/19 1020  BP: (!) 146/46 (!) 153/52  Pulse: (!) 58 (!) 57  Resp:    Temp:    SpO2: 99% 99%    Last Pain:  Vitals:   06/12/19 1020  TempSrc:   PainSc: 0-No pain                 Audry Pili

## 2019-06-12 NOTE — Transfer of Care (Signed)
Immediate Anesthesia Transfer of Care Note  Patient: Laura Mcintyre  Procedure(s) Performed: ESOPHAGOGASTRODUODENOSCOPY (EGD) WITH PROPOFOL (N/A ) STENT REMOVAL  Patient Location: PACU and Endoscopy Unit  Anesthesia Type:MAC  Level of Consciousness: awake, alert , oriented and patient cooperative  Airway & Oxygen Therapy: Patient Spontanous Breathing and Patient connected to face mask oxygen  Post-op Assessment: Report given to RN, Post -op Vital signs reviewed and stable and Patient moving all extremities  Post vital signs: Reviewed and stable  Last Vitals:  Vitals Value Taken Time  BP    Temp    Pulse    Resp    SpO2      Last Pain:  Vitals:   06/12/19 0854  TempSrc: Temporal  PainSc: 0-No pain         Complications: No apparent anesthesia complications

## 2019-06-12 NOTE — Progress Notes (Signed)
Laura Mcintyre 9:18 AM  Subjective: Patient doing well since I last saw her and she still is deciding about chemotherapy and her main complaints are her lower bowel and she is awaiting a decision about a [place] procedure for what sounds like a difficult to remove polyp but she has no upper GI complaints and no new problems since have seen her  Objective: Vital signs stable afebrile no acute distress exam please see preassessment evaluation  Assessment: Retained pancreatic duct stent  Plan: Okay to proceed with endoscopy and stent removal with anesthesia assistance  [facility]  office [phone] After 5PM or if no answer call [phone]

## 2019-06-13 ENCOUNTER — Encounter (HOSPITAL_COMMUNITY): Payer: Self-pay | Admitting: Gastroenterology

## 2019-06-15 ENCOUNTER — Encounter (HOSPITAL_COMMUNITY): Payer: Self-pay | Admitting: Hematology

## 2019-06-15 ENCOUNTER — Telehealth: Payer: Self-pay | Admitting: Cardiology

## 2019-06-15 NOTE — Telephone Encounter (Signed)
Needs refill on lisinopril 40 mgs. [facility] [place]

## 2019-06-16 ENCOUNTER — Telehealth: Payer: Self-pay

## 2019-06-16 MED ORDER — LISINOPRIL 40 MG PO TABS: ORAL_TABLET | ORAL | 0 refills | Status: AC

## 2019-06-16 NOTE — Telephone Encounter (Signed)
Spoke with patient's daughter discussed results of [facility] genomic testing, per Dr. Kyra Leyland no immunotherapy or targeted therapy is available.  She verbalized an understanding and they have an appointment with Dr. Hinton Rao at [place] on 8/26.

## 2019-06-16 NOTE — Telephone Encounter (Signed)
14 day supply of lisinopril with no refills sent [facility] [place] as patient is overdue for follow up.

## 2019-06-28 DIAGNOSIS — C787 Secondary malignant neoplasm of liver and intrahepatic bile duct: Secondary | ICD-10-CM

## 2019-06-28 DIAGNOSIS — C250 Malignant neoplasm of head of pancreas: Secondary | ICD-10-CM

## 2019-07-05 ENCOUNTER — Inpatient Hospital Stay
Admission: AD | Admit: 2019-07-05 | Payer: Medicare Other | Source: Other Acute Inpatient Hospital | Admitting: Internal Medicine

## 2019-07-05 DIAGNOSIS — C787 Secondary malignant neoplasm of liver and intrahepatic bile duct: Secondary | ICD-10-CM

## 2019-07-05 DIAGNOSIS — C250 Malignant neoplasm of head of pancreas: Secondary | ICD-10-CM

## 2019-07-05 NOTE — Care Management (Signed)
Transfer from [facility] to [facility] med-surg bed as inpt per PA, [place]  Female, 78 y.o., 1941/02/03 MRN: [id number]  78 year old lady with a past medical history of hypertension, hyperlipidemia, stroke, GERD, PUD, GI bleeding, recently diagnosed with metastasized pancreatic cancer.  Pt had obstructive jaundice with pancreatic mass and biliary and pancreatic duct dilatation. She had ERCP in July and was s/p of sphincterotomy and brushing done with stenting. Chart review showed that pt had EGD by Dr. Watt Climes on 8/10, and "Two previously placed metal and plastic stents were seen in the second portion of the duodenum. Stent removal was accomplished with a regular forceps of the plastic pancreatic stent only". Pt presented with severe nausea, vomiting, and mild epigastric abdominal pain.   Patient was found to have lipase 1415, WBC 13.2, INR 1.0, AST 37, ALT 26, total bilirubin 0.8, creatinine 1.0, BUN 27.  Temperature normal, blood pressure 176/76, heart rate 86, oxygen saturation 96% on room air.  CT scan showed worsening pancreatic lesion and liver lesion.  Due to mass compression to duodenum, patient's stomach is distended.  NG tube is placed.  Patient may need feeding tube placement. Pt is accepted to MedSurg bed as inpatient. I told EDP that although we are accepting the patient, yet we do not have bed currently. Patient may not be able to get here in a timely fashion. Since pt is stable, will be on list for transfer.  Ivor Costa, MD.

## 2019-08-03 DEATH — deceased

## 2019-08-31 ENCOUNTER — Ambulatory Visit: Payer: Medicare Other | Admitting: Cardiology

## 2020-08-02 DEATH — deceased

## 2021-07-08 IMAGING — CR ABDOMEN - 1 VIEW
2 series · 2 of 2 positions shown · non-contrast
Comparison: ERCP 05/05/2019

CLINICAL DATA: Evaluate for passage of plastic biliary stent

EXAM:
ABDOMEN - 1 VIEW

[t abdomen supine (1 of 2)]
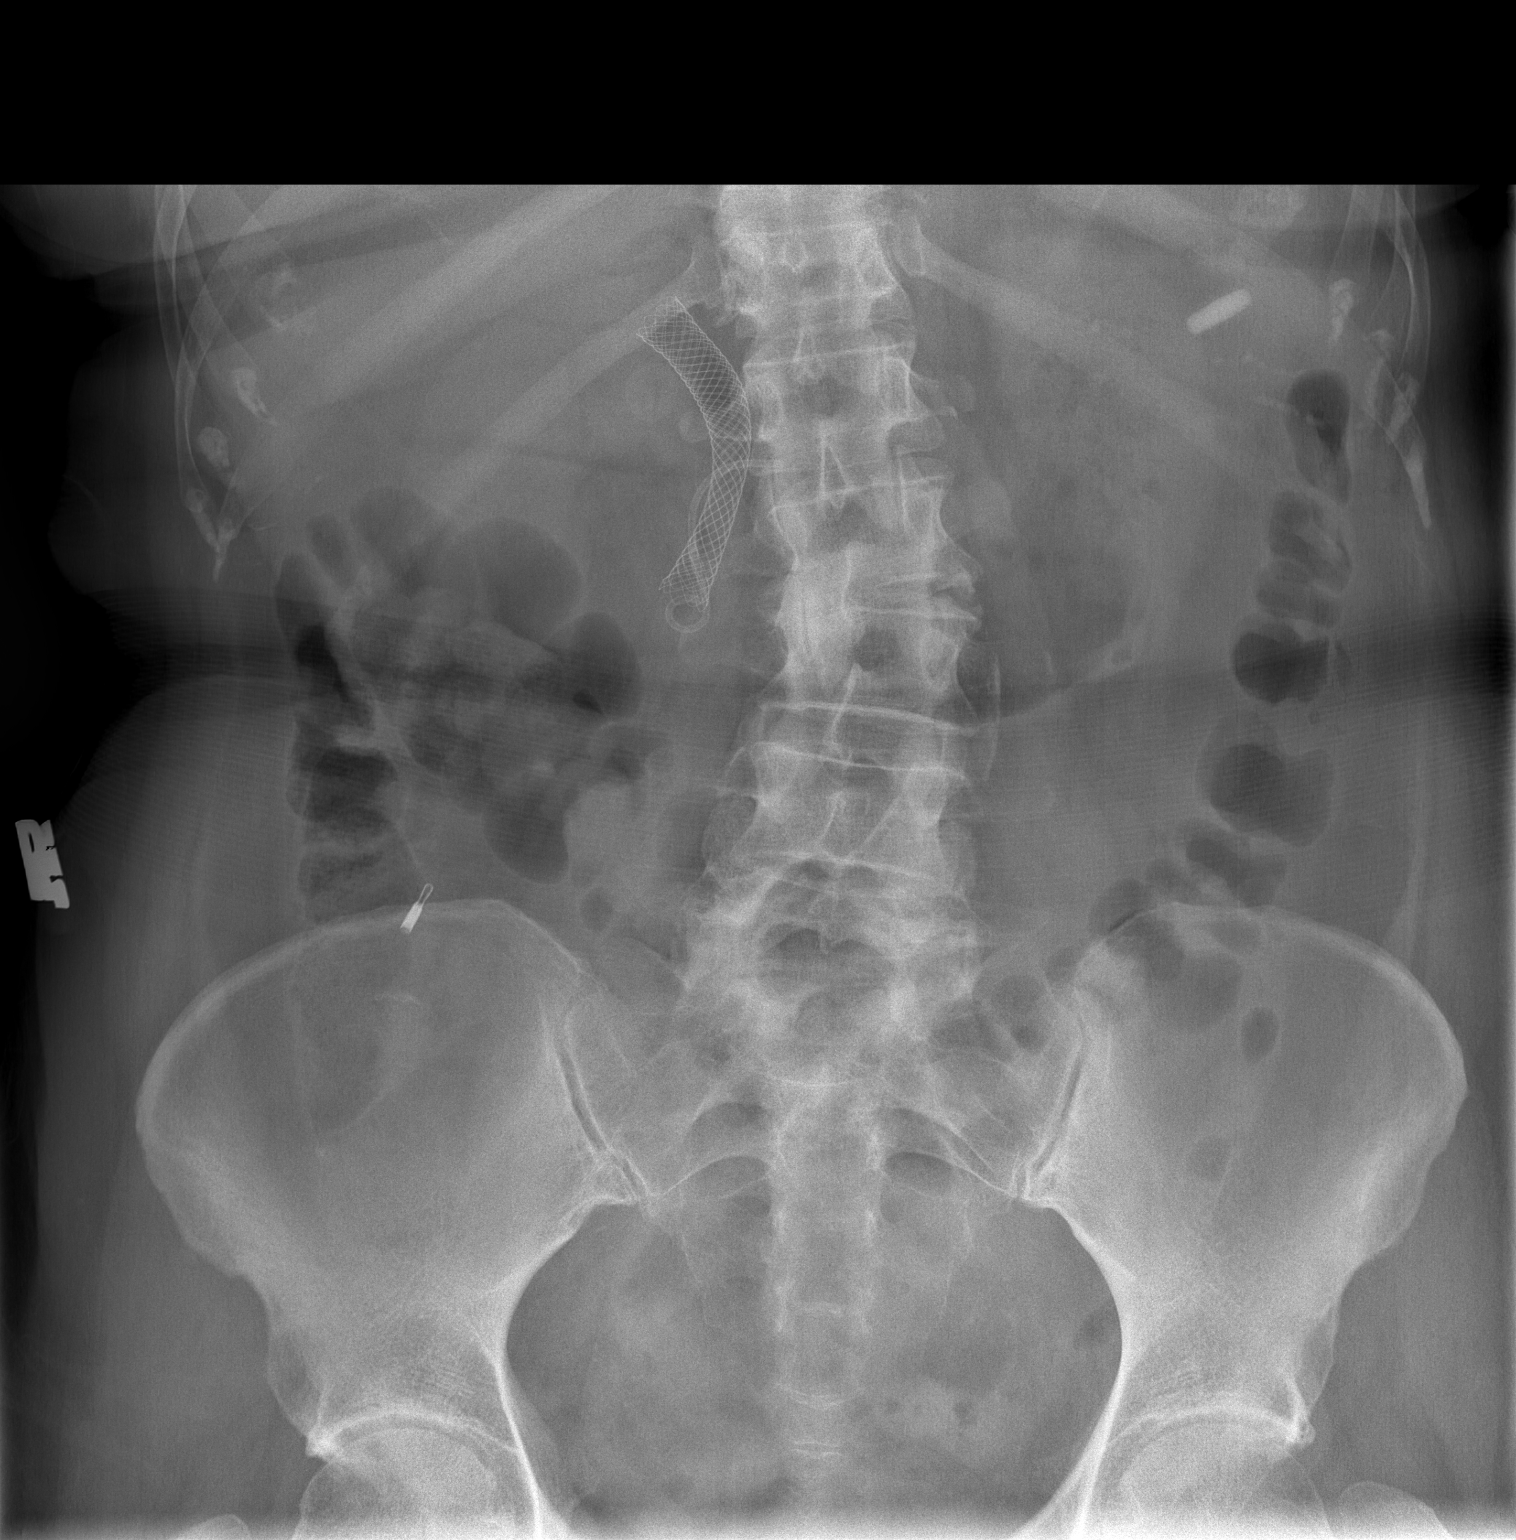

[t abdomen supine (2 of 2)]
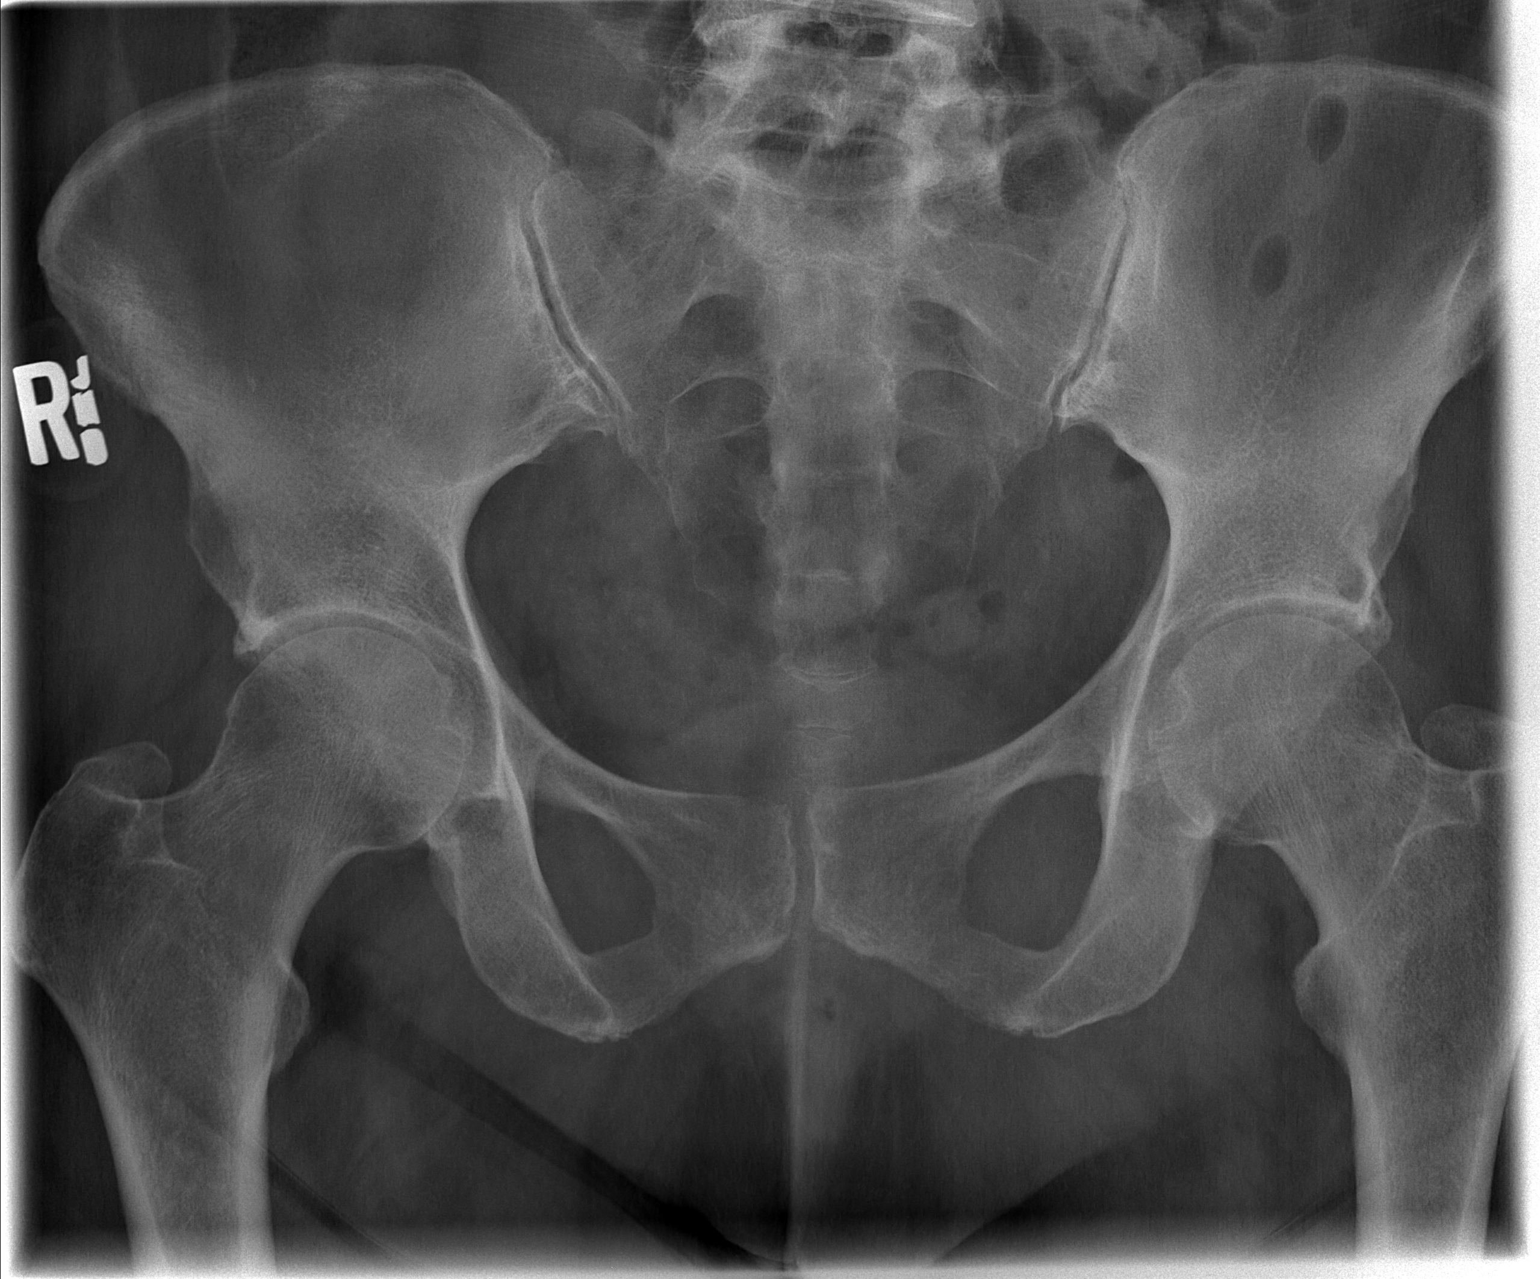

[2 of 2 positions shown; findings below may reference images not displayed]

FINDINGS: Metallic stent is noted in the right upper quadrant compatible with
biliary positioning. Small metallic clips noted in the right lower
quadrant, stable since CT from 05/03/2019. Plastic stent is seen
overlying the lower portion of the metallic stent, coursing
vertically, thin horizontally near the midportion of the metallic
stent. This could be within the common bile duct or the pancreatic
duct. Nonobstructive bowel gas pattern.
IMPRESSION: Both plastic and metallic stents noted in the right abdomen.

Nonobstructive bowel gas pattern.

## 2021-07-15 IMAGING — US ULTRASOUND BIOPSY CORE LIVER
1 series · 13 of 25 positions shown · non-contrast
Comparison: none

INDICATION: 77-year-old female with pancreas mass/pancreatic carcinoma, with
concern for liver metastases

[Series 1: ultrasound biopsy core liver · 13 of 33 slices shown]
[im 1/33]
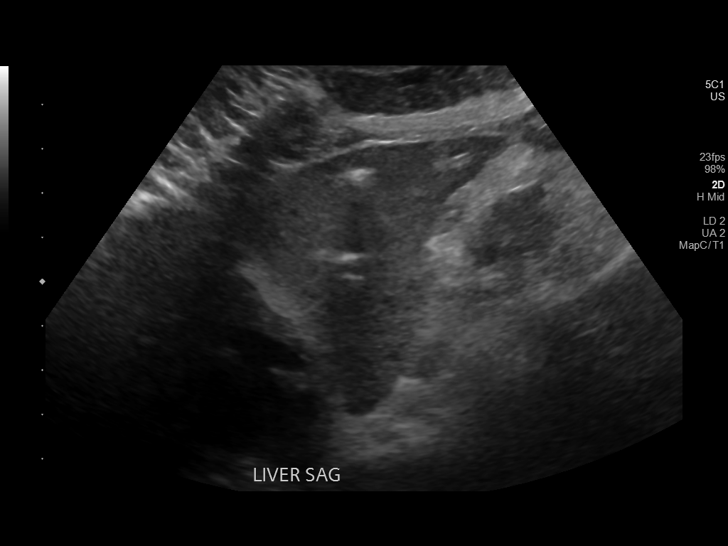
[im 3/33]
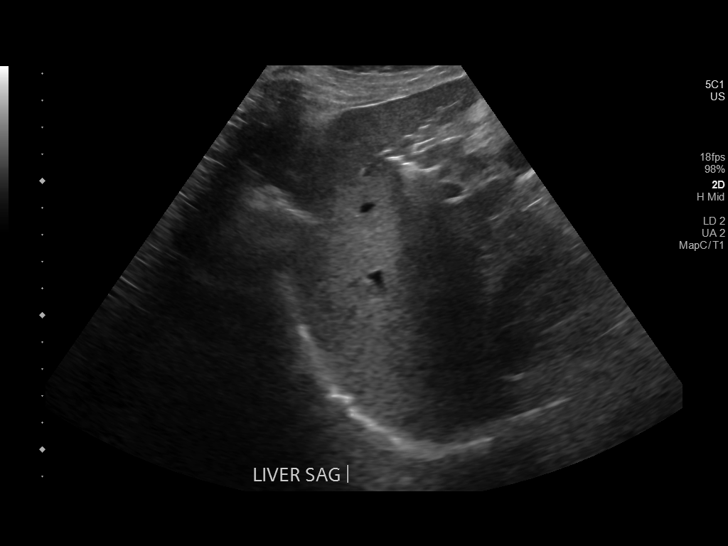
[im 6/33]
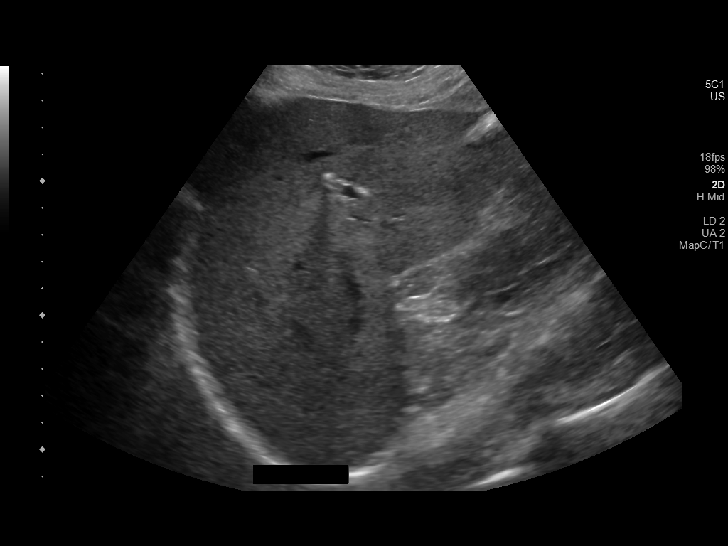
[im 9/33]
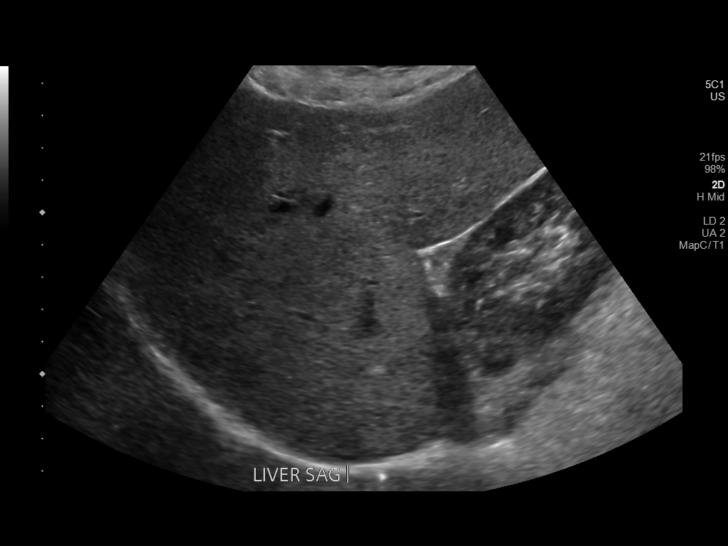
[im 11/33]
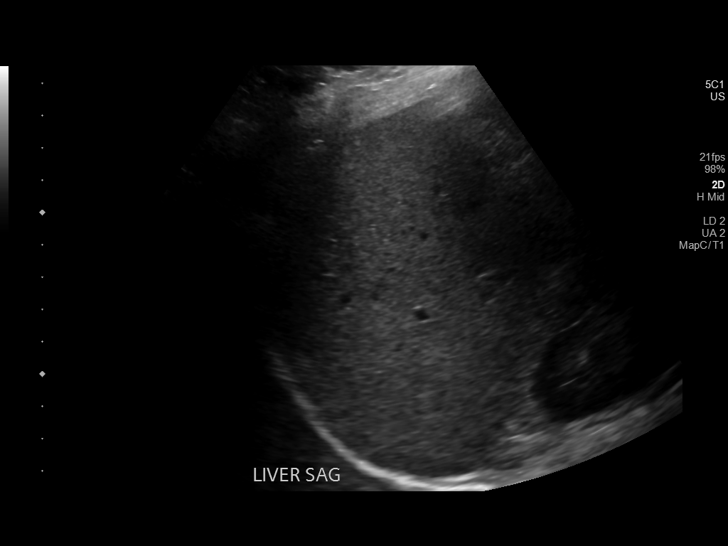
[im 14/33]
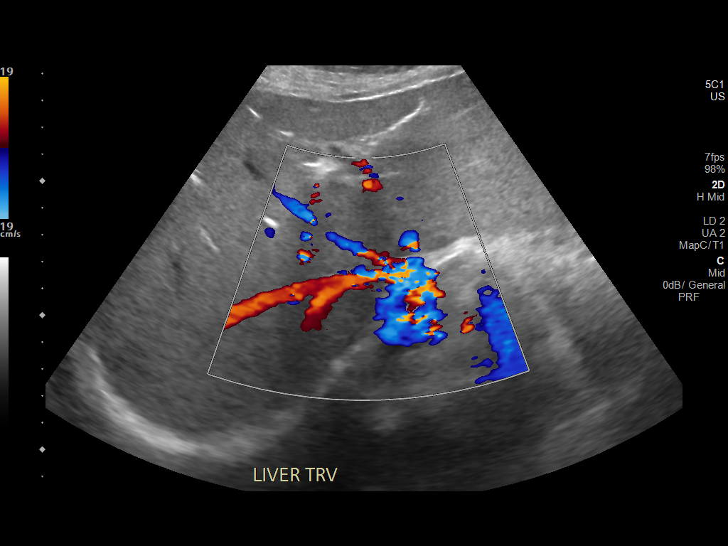
[im 17/33]
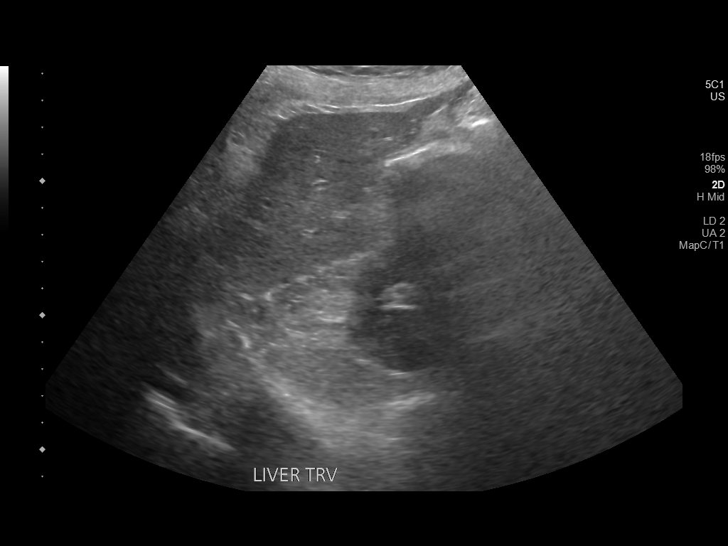
[im 19/33]
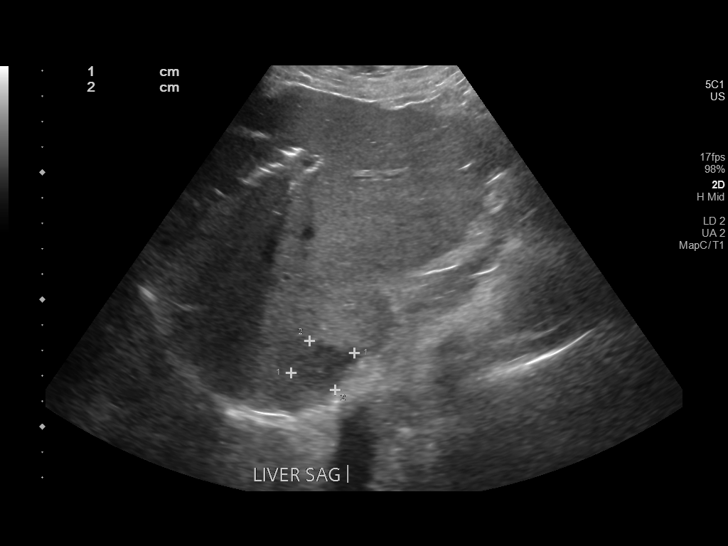
[im 22/33]
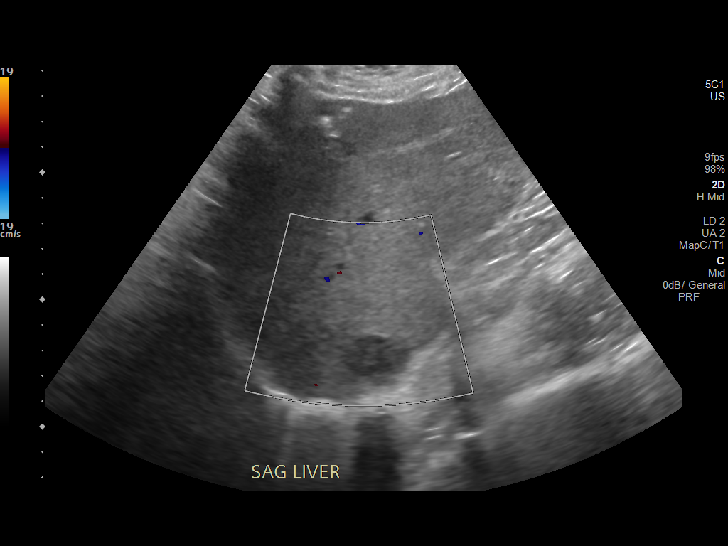
[im 25/33]
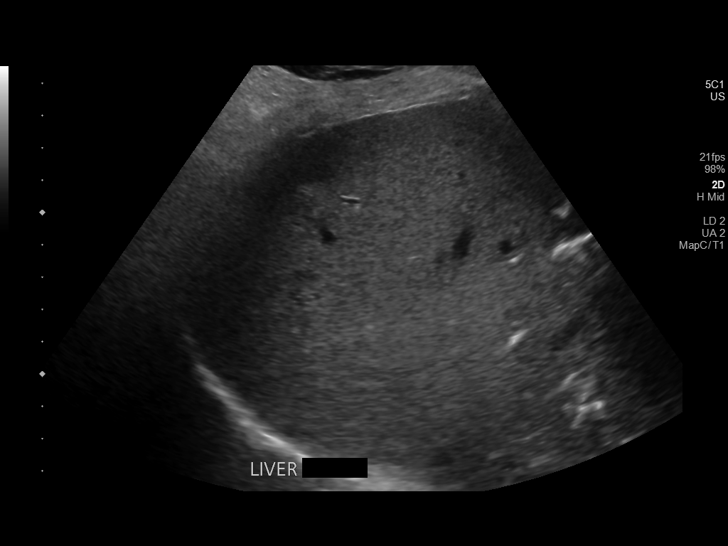
[im 27/33]
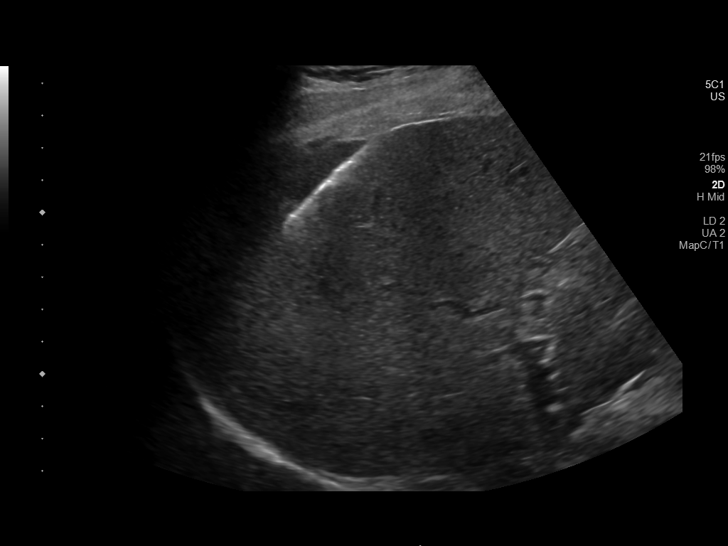
[im 30/33]
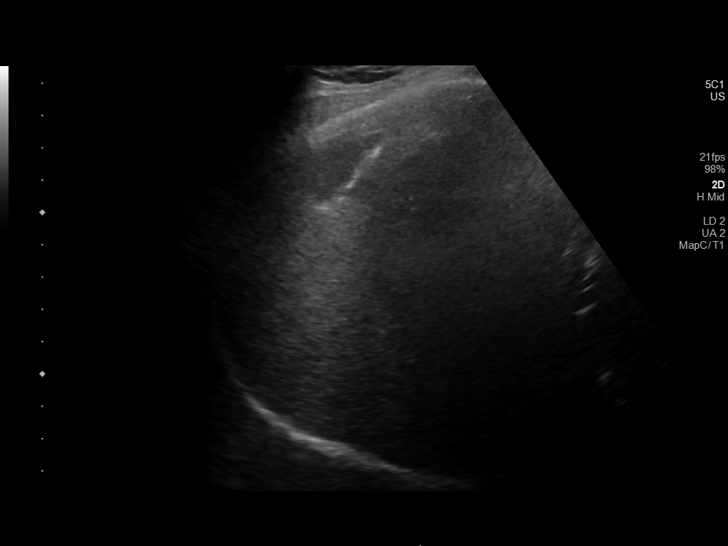
[im 33/33]
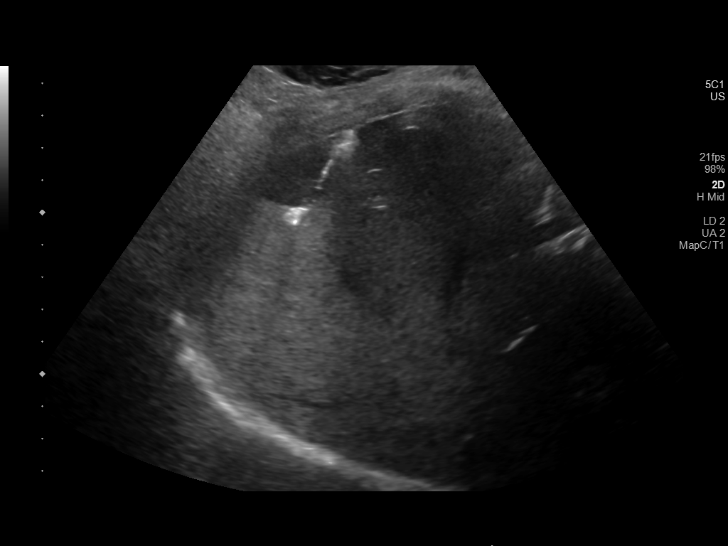

[13 of 25 positions shown; findings below may reference images not displayed]

EXAM:
ULTRASOUND-GUIDED LIVER MASS BIOPSY

MEDICATIONS:
None.

ANESTHESIA/SEDATION:
Moderate (conscious) sedation was employed during this procedure. A
total of Versed 2.0 mg and Fentanyl 100 mcg was administered
intravenously.

Moderate Sedation Time: 15 minutes. The patient's level of
consciousness and vital signs were monitored continuously by
radiology nursing throughout the procedure under my direct
supervision.

FLUOROSCOPY TIME:  Ultrasound

COMPLICATIONS:
None

PROCEDURE:
Informed written consent was obtained from the patient after a
thorough discussion of the procedural risks, benefits and
alternatives. All questions were addressed. Maximal Sterile Barrier
Technique was utilized including caps, mask, sterile gowns, sterile
gloves, sterile drape, hand hygiene and skin antiseptic. A timeout
was performed prior to the initiation of the procedure.

Ultrasound survey of the right liver lobe performed with images
stored and sent to PACs.

The right lower thorax/right upper abdomen was prepped with
chlorhexidine in a sterile fashion, and a sterile drape was applied
covering the operative field. A sterile gown and sterile gloves were
used for the procedure. Local anesthesia was provided with 1%
Lidocaine.

Once the patient is prepped and draped sterilely and the skin and
subcutaneous tissues were generously infiltrated with 1% lidocaine,
a small stab incision was made with an 11 blade scalpel.

A 17 gauge introducer needle was advanced under ultrasound guidance
in an intercostal location into the right liver lobe, targeting the
hypoechoic mass in the right liver. The stylet was removed, and
multiple separate 18 gauge core biopsy were retrieved. Samples were
placed into formalin for transportation to the lab.

Gel-Foam pledgets were then infused with a small amount of saline
for assistance with hemostasis.

The needle was removed, and a final ultrasound image was performed.

The patient tolerated the procedure well and remained
hemodynamically stable throughout.

No complications were encountered and no significant blood loss was
encounter.
IMPRESSION: Status post ultrasound-guided biopsy of right liver mass. Tissue
specimen sent to pathology for complete histopathologic analysis.

## 2021-07-15 IMAGING — DX ABDOMEN - 2 VIEW
2 series · 2 of 2 positions shown · non-contrast
Comparison: Plain film of the abdomen dated 05/16/2019.
COMPARISON: Plain film of the abdomen dated 05/16/2019.

Addendum:
CLINICAL DATA: Scheduled for liver biopsy today. Recent diagnosis
of pancreatic cancer. Abdominal discomfort, pain, nausea.

EXAM:
ABDOMEN - 2 VIEW

[abdomen erect]
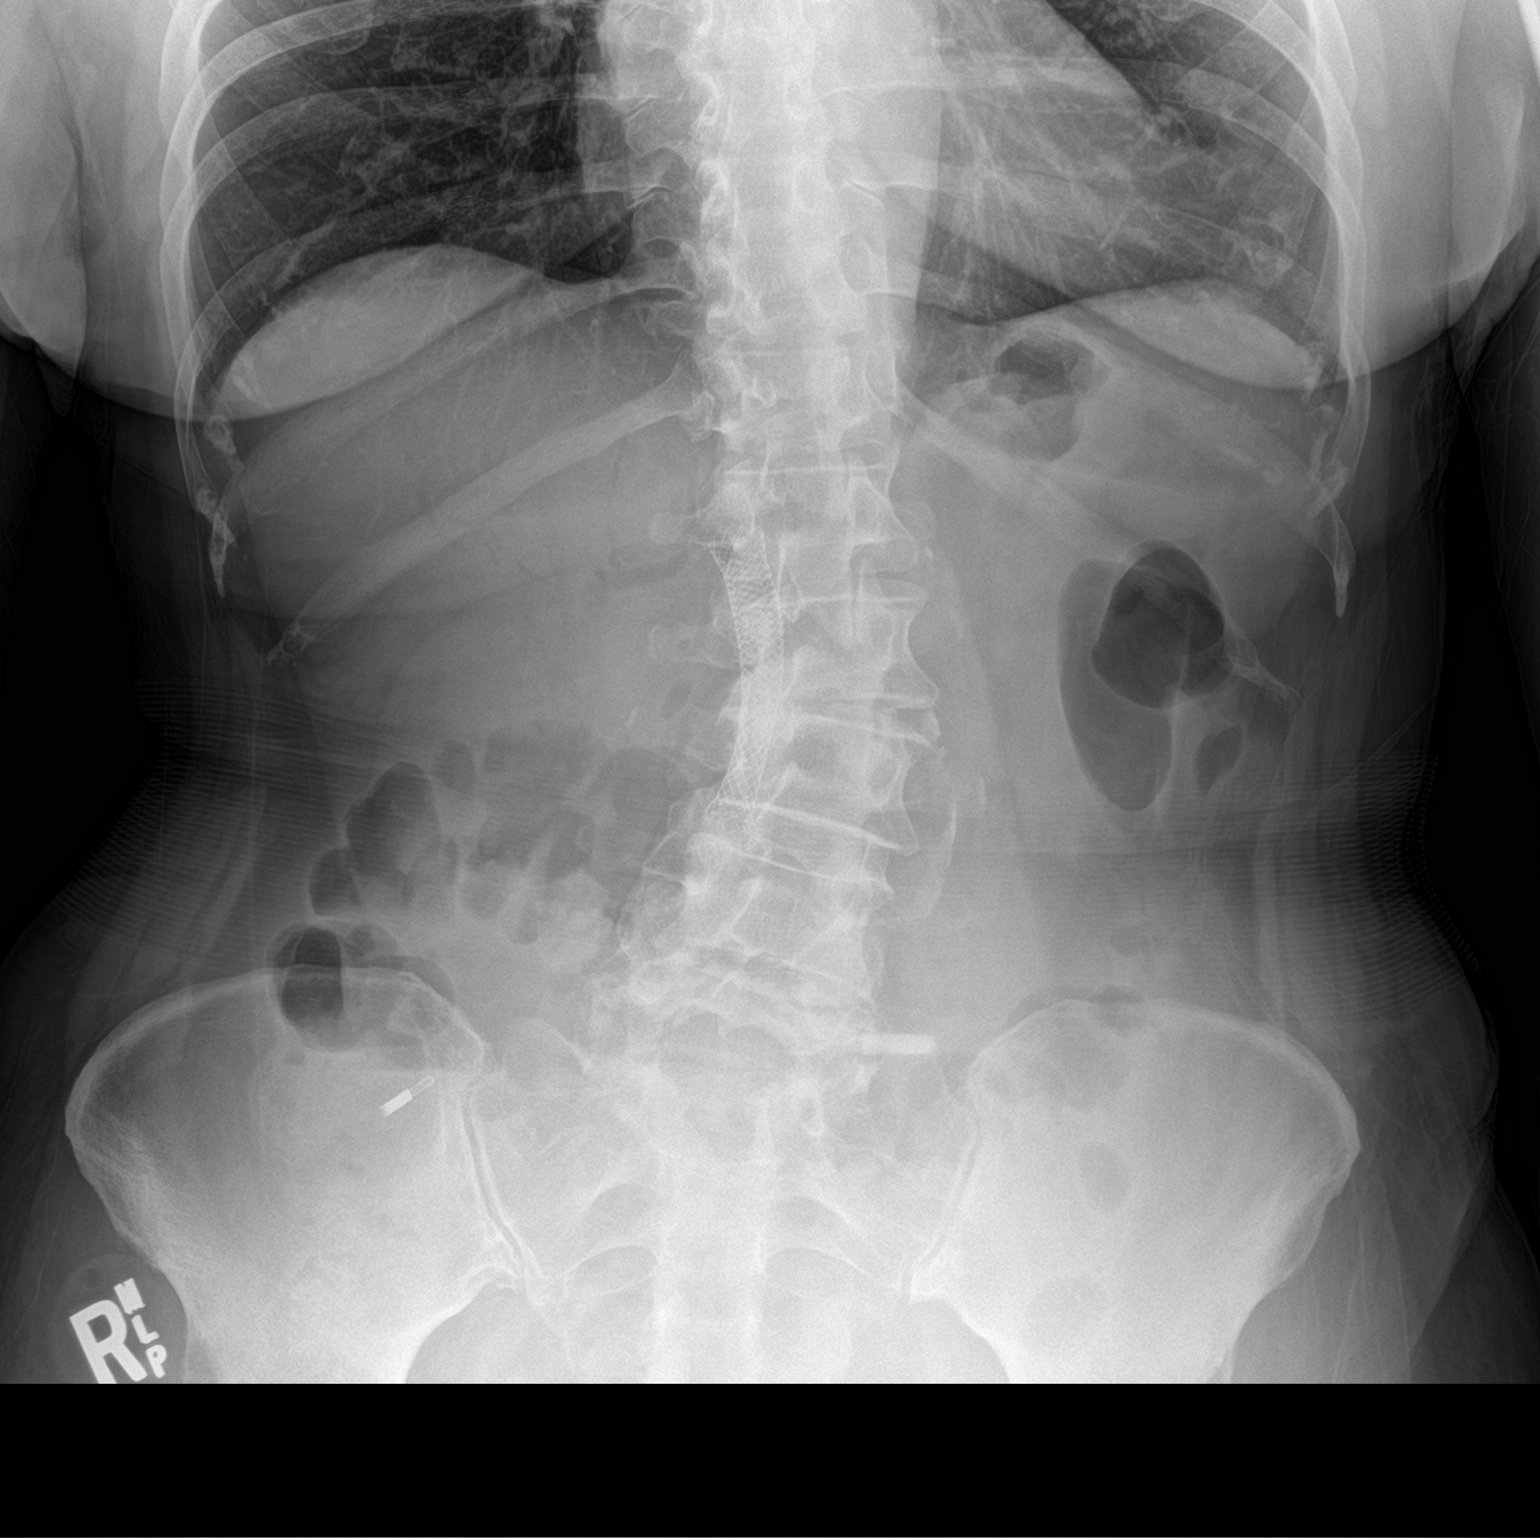

[abdomen supine]
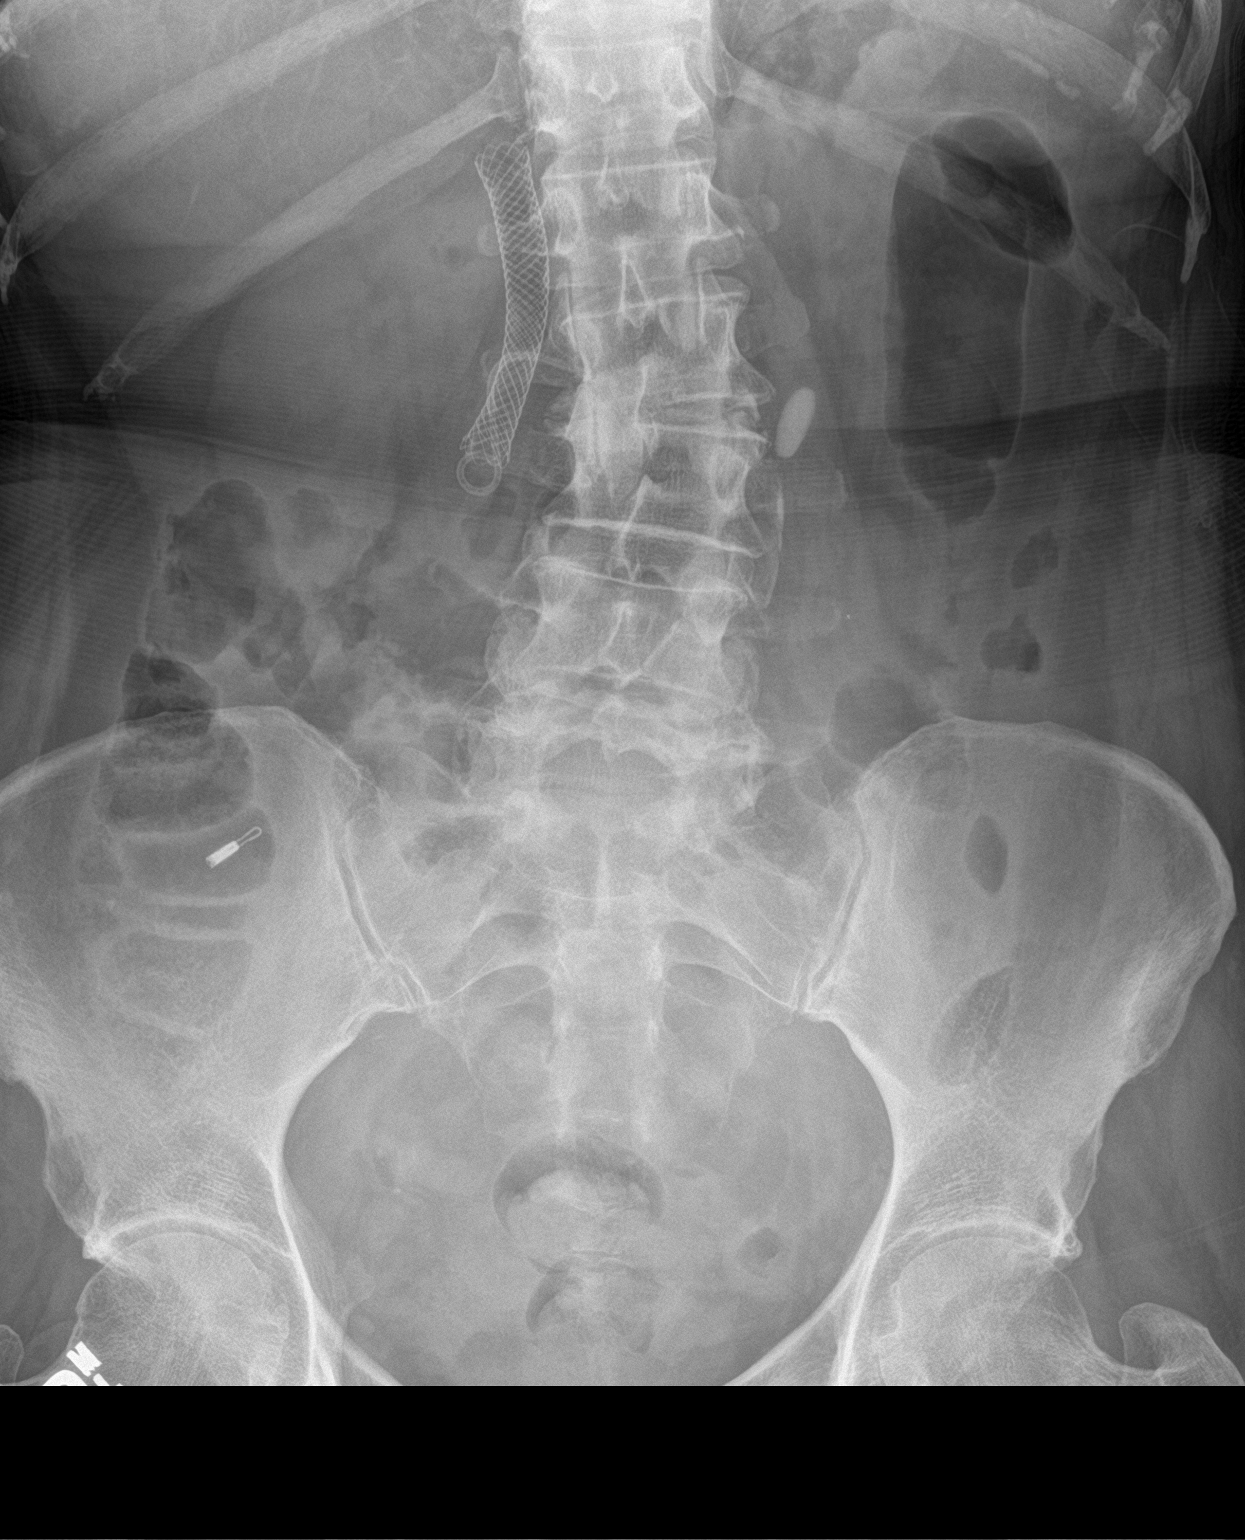

[2 of 2 positions shown; findings below may reference images not displayed]

FINDINGS: Bowel gas pattern is nonobstructive. No evidence of free
intraperitoneal air or abnormal fluid collection. Bile duct stent
appears stable in position. Lung bases are clear. Aortic
atherosclerosis.
IMPRESSION: No acute findings. Nonobstructive bowel gas pattern. Bile duct stent
is stable in position.

ADDENDUM:
Addendum requested by the ordering physician for description of the
location of the pancreatic duct stent. The pancreatic duct stent
does appear stable in position and configuration compared to the
earlier plain film of 05/16/2019, again overlapping the distal
portion of the CBD stent.

*** End of Addendum ***
FINDINGS: Bowel gas pattern is nonobstructive. No evidence of free
intraperitoneal air or abnormal fluid collection. Bile duct stent
appears stable in position. Lung bases are clear. Aortic
atherosclerosis.
IMPRESSION: No acute findings. Nonobstructive bowel gas pattern. Bile duct stent
is stable in position.

## 2021-08-02 DEATH — deceased
